# Patient Record
Sex: Female | Born: 1980 | Race: White | Hispanic: No | Marital: Married | Smoking: Never smoker
Health system: Southern US, Community
[De-identification: ages and names within clinical notes are randomized; demographics above are authoritative.]

## PROBLEM LIST (undated history)

## (undated) DIAGNOSIS — Z8669 Personal history of other diseases of the nervous system and sense organs: Secondary | ICD-10-CM

## (undated) DIAGNOSIS — F419 Anxiety disorder, unspecified: Secondary | ICD-10-CM

## (undated) DIAGNOSIS — S52502A Unspecified fracture of the lower end of left radius, initial encounter for closed fracture: Secondary | ICD-10-CM

## (undated) DIAGNOSIS — F319 Bipolar disorder, unspecified: Secondary | ICD-10-CM

## (undated) DIAGNOSIS — I10 Essential (primary) hypertension: Secondary | ICD-10-CM

## (undated) DIAGNOSIS — Z8679 Personal history of other diseases of the circulatory system: Secondary | ICD-10-CM

## (undated) DIAGNOSIS — M6740 Ganglion, unspecified site: Secondary | ICD-10-CM

## (undated) DIAGNOSIS — E079 Disorder of thyroid, unspecified: Secondary | ICD-10-CM

## (undated) DIAGNOSIS — E119 Type 2 diabetes mellitus without complications: Secondary | ICD-10-CM

## (undated) DIAGNOSIS — E039 Hypothyroidism, unspecified: Secondary | ICD-10-CM

## (undated) DIAGNOSIS — Q969 Turner's syndrome, unspecified: Secondary | ICD-10-CM

## (undated) DIAGNOSIS — G43909 Migraine, unspecified, not intractable, without status migrainosus: Secondary | ICD-10-CM

## (undated) HISTORY — PX: CHOLECYSTECTOMY: SHX55

## (undated) HISTORY — DX: Migraine, unspecified, not intractable, without status migrainosus: G43.909

## (undated) HISTORY — DX: Bipolar disorder, unspecified: F31.9

## (undated) HISTORY — PX: COLONOSCOPY: SHX174

## (undated) HISTORY — DX: Anxiety disorder, unspecified: F41.9

## (undated) HISTORY — PX: CORNEAL TRANSPLANT: SHX108

## (undated) HISTORY — DX: Disorder of thyroid, unspecified: E07.9

---
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
## 2001-08-10 ENCOUNTER — Encounter: Payer: Self-pay | Admitting: *Deleted

## 2001-08-10 ENCOUNTER — Emergency Department (HOSPITAL_COMMUNITY): Admission: EM | Admit: 2001-08-10 | Discharge: 2001-08-11 | Payer: Self-pay | Admitting: Emergency Medicine

## 2002-04-22 ENCOUNTER — Emergency Department (HOSPITAL_COMMUNITY): Admission: EM | Admit: 2002-04-22 | Discharge: 2002-04-23 | Payer: Self-pay | Admitting: Emergency Medicine

## 2002-04-23 ENCOUNTER — Encounter: Payer: Self-pay | Admitting: Emergency Medicine

## 2003-08-11 ENCOUNTER — Emergency Department (HOSPITAL_COMMUNITY): Admission: EM | Admit: 2003-08-11 | Discharge: 2003-08-11 | Payer: Self-pay | Admitting: Emergency Medicine

## 2003-12-06 ENCOUNTER — Other Ambulatory Visit: Admission: RE | Admit: 2003-12-06 | Discharge: 2003-12-06 | Payer: Self-pay | Admitting: Obstetrics and Gynecology

## 2005-01-07 ENCOUNTER — Other Ambulatory Visit: Admission: RE | Admit: 2005-01-07 | Discharge: 2005-01-07 | Payer: Self-pay | Admitting: Obstetrics and Gynecology

## 2006-03-17 HISTORY — PX: OTHER SURGICAL HISTORY: SHX169

## 2006-03-17 HISTORY — PX: CHOLECYSTECTOMY: SHX55

## 2007-03-18 HISTORY — PX: CORNEAL TRANSPLANT: SHX108

## 2007-05-12 ENCOUNTER — Observation Stay (HOSPITAL_COMMUNITY): Admission: EM | Admit: 2007-05-12 | Discharge: 2007-05-13 | Payer: Self-pay | Admitting: Emergency Medicine

## 2007-11-13 ENCOUNTER — Ambulatory Visit: Payer: Self-pay | Admitting: *Deleted

## 2007-11-13 ENCOUNTER — Emergency Department (HOSPITAL_COMMUNITY): Admission: EM | Admit: 2007-11-13 | Discharge: 2007-11-14 | Payer: Self-pay | Admitting: Emergency Medicine

## 2007-11-14 ENCOUNTER — Inpatient Hospital Stay (HOSPITAL_COMMUNITY): Admission: AD | Admit: 2007-11-14 | Discharge: 2007-11-18 | Payer: Self-pay | Admitting: *Deleted

## 2007-12-03 ENCOUNTER — Emergency Department (HOSPITAL_COMMUNITY): Admission: EM | Admit: 2007-12-03 | Discharge: 2007-12-03 | Payer: Self-pay | Admitting: Emergency Medicine

## 2007-12-22 ENCOUNTER — Emergency Department (HOSPITAL_COMMUNITY): Admission: EM | Admit: 2007-12-22 | Discharge: 2007-12-23 | Payer: Self-pay | Admitting: Emergency Medicine

## 2007-12-23 ENCOUNTER — Ambulatory Visit: Payer: Self-pay | Admitting: Psychiatry

## 2007-12-23 ENCOUNTER — Inpatient Hospital Stay (HOSPITAL_COMMUNITY): Admission: AD | Admit: 2007-12-23 | Discharge: 2007-12-30 | Payer: Self-pay | Admitting: Psychiatry

## 2010-04-07 ENCOUNTER — Encounter: Payer: Self-pay | Admitting: Internal Medicine

## 2010-07-30 NOTE — Discharge Summary (Signed)
NAME:  Holly Richards, Holly Richards NO.:  [id number]   MEDICAL RECORD NO.:  [id number]          PATIENT TYPE:  IPS   LOCATION:  0301                          FACILITY:  [facility]   PHYSICIAN:  Anselm Jungling, MD  DATE OF BIRTH:  07/24/1980   DATE OF ADMISSION:  12/23/2007  DATE OF DISCHARGE:  12/30/2007                               DISCHARGE SUMMARY   IDENTIFYING DATA/REASON FOR ADMISSION:  This was an inpatient  psychiatric admission for [facility], a 30 year old single Caucasian female  who was admitted due to sudden onset of altered mental status, with  confusion, and reported auditory hallucinations.  She had a similar  presentation and admission to our facility within the past 2 months.  She came to [place] as a patient of her psychiatrist, Dr. Evelene Croon, and her  therapist, Britta Mccreedy.  Please refer to the admission note for further  details pertaining to the symptoms, circumstances and history that led  to her hospitalization.  She was given an initial Axis I diagnosis of  rule out psychosis NOS.   MEDICAL/LABORATORY:  The patient is in generally good health, but has a  history of hypothyroidism.  She was medically and physically assessed by  the psychiatric nurse practitioner.  She was continued on her usual  Synthroid 88 mcg daily, and Pred Forte drops 4 times daily.  She was  also continued on her usual Zocor 20 mg daily.  There were no  significant medical issues.   HOSPITAL COURSE:  The patient was admitted to the adult inpatient  psychiatric service.  The patient has Turner syndrome.  She has typical  body habitus.  Otherwise she is well-nourished and normally developed.  The patient was initially on one-to-one observation due to be  unpredictable an impulsive and emotional nature of her behaviors in the  prehospital and admission process.  Her medications were held initially.   The undersigned had first encountered the patient in February of this  year, when she was hospitalized  in the medical hospital due to sudden  alteration in mental status.  At that time, medical causes for this were  ruled out.  As it turned out, the patient, just prior to this event, had  restarted Celexa at a high dose of 80 mg daily, after being off it for  some time, and had combined it with prescription Vicodin.  Her altered  mental status and brief delirium was attributed to some interaction  related to these medications.  She was subsequently sent home and return  to the outpatient care of Dr. Evelene Croon.   More recently, she was admitted to our inpatient psychiatric facility  with similar history and presentation, and then for this most current  hospital stay, the presenting signs and symptoms were very similar  again.  That is, she was confused, disoriented, claimed that she was  someone named [place] who live in [place], [place], and acted  as though she did not recognize her husband.   Based upon the patient's presentation during this inpatient stay as well  as her previous presentations, it was felt  that the patient was not  showing signs or symptoms of true psychotic disorder.  Rather, it  appeared that she was exhibiting episodes that were explainable through  some dissociative mechanism or some form of conversion.   The patient does have a true history of obsessive-compulsive disorder  and because of this she has been on various anti-obsessional agents,  including other SSRIs, and the current regimen that she has had as an  outpatient, consisting of Neurontin and Zoloft.  However, these  medications were held initially pending observation and further  assessment of her overall situation and diagnosis.   Dr. Evelene Croon was contacted and the undersigned discussed the patient's  history and treatment strategies at length.  Dr. Evelene Croon was in agreement  that the patient's episodic confusion appeared to be based on some form  of dissociative reaction and that antipsychotic medication  did not  appear to be indicated.  We agreed upon resumption of her Zoloft, with  stepwise increase of her daily dosage to 200 mg daily, and discharge  back to outpatient care once the patient was behaviorally stable.  This  was also discussed with the patient's mother and had lengthy telephone  conversation that the undersigned had with her on December 28, 2007.   The patient's thinking, behavior, and mood appeared to stabilize quite  well over the first 2-3 days of her inpatient stay.  One-to-one  observation was discontinued, and she was subsequently able to be  transferred to the open psychiatric unit where she participated in  therapeutic groups and activities and generally did well.   She was discharged on the 8th hospital day in good spirits, fully in  touch with reality, and agreeable to following aftercare plan.   AFTERCARE:  The patient was to follow up with Dr. Evelene Croon, with a followup  appointment scheduled for November 10.  In addition, she was to see her  usual therapist, Britta Mccreedy on January 03, 2008.   DISCHARGE MEDICATIONS:  1. Synthroid 88 mcg daily.  2. Pred Forte drops 4 daily.  3. Zocor 20 mg daily.  4. Neurontin 300 mg q.i.d.  5. Zoloft 200 mg daily.  6. Ambien 10 mg at bedtime p.r.n. insomnia.   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  Axis II:  Borderline personality traits.  Axis III:  History of hypothyroidism, hyperlipidemia.  Axis IV:  Stressors severe.  Axis V:  GAF on discharge.      Anselm Jungling, MD  Electronically Signed     SPB/MEDQ  D:  12/30/2007  T:  12/30/2007  Job:  [id number]

## 2010-07-30 NOTE — Discharge Summary (Signed)
NAMEMarland Richards  AGUSTA, HACKENBERG NO.:  [id number]   MEDICAL RECORD NO.:  [id number]          PATIENT TYPE:  IPS   LOCATION:  0307                          FACILITY:  [facility]   PHYSICIAN:  Jasmine Pang, M.D. DATE OF BIRTH:  1980/04/27   DATE OF ADMISSION:  11/14/2007  DATE OF DISCHARGE:  11/18/2007                               DISCHARGE SUMMARY   IDENTIFICATION:  This is a 30 year old married female who was admitted  on a voluntary basis on November 13, 2007.   HISTORY OF PRESENT ILLNESS:  The patient has a history of suicidal  thoughts.  She is unable to contract.  She states she got mad  yesterday.  She has been reporting pain in her arms.  The patient has  Turner syndrome.  She has had mental status changes in the past several  months with mood changes occurring.  She has a history of OCD.  She  states she went to [facility] and then [facility].  She was  complaining of pain in her arm and they checked her heart.  She was  upset because they did not take care of her arm pain.  She admitted to  the [facility] physician to positive suicidal ideation.  She agreed to  come to [facility].  She states she was told she  was going to have a private room.  She got very upset when she was  placed in her room with a roommate.  She is not able to move past the  fact that she has a roommate.  She has been very irritable.   PAST PSYCHIATRIC HISTORY:  This is the first [facility] admission for the  patient.  She currently sees Dr. Evelene Croon for medication management.   ALCOHOL AND DRUG HISTORY:  None known.   MEDICAL PROBLEMS:  Turner syndrome.   MEDICATIONS:  1. Celexa 80 mg daily.  She is on this for obsessive-compulsive      disorder.  2. Gabapentin 300 mg t.i.d.  3. Ativan 0.5 mg t.i.d.   DRUG ALLERGIES:  CODEINE.   PHYSICAL FINDINGS:  The patient was assessed at the [facility] ED  with a negative Doppler.  There were no acute physical or  medical  problems noted.   DIAGNOSTIC STUDIES:  Alcohol level was less than 5.  UDS was positive  for benzodiazepines.  Urine pregnancy test was negative.  CBC was within  normal limits.   HOSPITAL COURSE:  Upon admission, the patient was placed on her home  medications of Ativan 0.5 mg p.o. t.i.d. and Celexa 80 mg daily.  She  was also placed on Ambien 10 mg p.o. q.h.s. p.r.n. sleep, Motrin 600 mg  p.o. q.6 hours p.r.n. pain, and gabapentin 300 mg p.o. q.i.d. (home  medication).  In addition, she was placed on her Synthroid 88 mcg one  daily.  In individual sessions, the patient was depressed and anxious.  She was also irritable.  She continued to focus on the fact that she had  a roommate and states she was told she was  going to have a private room.  There was positive suicidal ideation.  She did not feel the Celexa had  been beneficial.  This was discontinued and she was started on Zoloft 50  mg p.o. daily x2 days then 100 mg p.o. daily.  She was also started on  Seroquel 50 mg q.4 hours p.r.n. anxiety.  The patient tolerated her  medications well with no significant side effects.  She did participate  appropriately in unit therapeutic groups and activities.  Social worker  spoke with the patient's husband to confirm that his guns have been  secured.  He stated all the guns will be at out of the house before the  patient gets home.  On November 16, 2007, the patient was still  depressed and anxious.  There was some positive suicidal ideation.  She  talked with her husband the day before and this went well.  He visited,  and she was happy to see him.  She was anticipating family session with  her husband and mother.  On November 17, 2007, the patient was still  somewhat depressed and anxious.  On November 18, 2007, sleep was good,  appetite was good.  She was less depressed, less anxious.  Affect was  consistent with mood.  There was no suicidal or homicidal ideation.  No  thoughts  of self-injurious behavior.  No auditory or visual  hallucinations.  No paranoia or delusions.  Thoughts were logical and  goal-directed.  Thought content, no predominant theme.  Cognitive was  grossly back to baseline.  She was worried about not being ready for  going home, but after the family session, felt much better.  She  received a lot of support in the session.  They made a plan for how to  surround her with people constantly in order for her to be able to leave  the hospital.  Family very much wanted her home.   DISCHARGE DIAGNOSES:  Axis I:  Mood disorder not otherwise specified.  Obsessive-compulsive disorder.  Axis II:  None.  Axis III:  Turner syndrome.  Axis IV:  Moderate (problems with primary support group, medical  problems, burden of psychiatric illness).  Axis V:  Global assessment of functioning upon discharge was 50.  GAF  upon admission was 35.  GAF highest past year was 60-65.   DISCHARGE PLAN:  There was no specific activity level or dietary  restrictions.   POSTHOSPITAL CARE PLANS:  The patient will see Dr. Evelene Croon, her  psychiatrist on November 25, 2007, at 5 o'clock p.m.  She will also see  a counselor, Karie Georges on November 25, 2007, at 2 o'clock p.m.   DISCHARGE MEDICATIONS:  1. Lorazepam 0.5 mg t.i.d.  2. Neurontin 300 mg q.i.d.  3. Levothyroxine 88 mcg daily.  4. Sertraline 100 mg daily.  5. Ambien 10 mg at bedtime.  6. Seroquel 50 mg every 4 hours as needed for anxiety.      Jasmine Pang, M.D.  Electronically Signed     BHS/MEDQ  D:  11/18/2007  T:  11/19/2007  Job:  [id number]

## 2010-07-30 NOTE — Discharge Summary (Signed)
NAMETAMICO, MUNDO NO.:  [id number]   MEDICAL RECORD NO.:  [id number]          PATIENT TYPE:  INP   LOCATION:  5122                         FACILITY:  [facility]   PHYSICIAN:  Eduard Clos, MDDATE OF BIRTH:  [birth year]   DATE OF ADMISSION:  05/11/2007  DATE OF DISCHARGE:  05/13/2007                               DISCHARGE SUMMARY   HOSPITAL COURSE:  A 30 year old female with known history of depression,  history of Turner's syndrome, hyperlipidemia. It was noted the patient  was having spells of confusion and the patient was given Geodon and  Ativan to sedate and is admitted to medical floor for further work-up.  Patient also had a CAT scan of the head which did not show any acute  findings.  Subsequently patient became oriented and felt better.  Since  patient had this headache primarily on the left side behind her eyes,  ophthalmology was consulted, as patient had a recent left corneal  transplant for scarred cornea.  Per ophthalmologist, patient did not  have any acute eye findings and advised to continue the present  medication for eyes and to follow with her ophthalmologist once  discharged.  Secondary consult was obtained as per Dr. __________ ,  psychiatrist.  Patient had delirium from white coat and along with  Celexa and advised to start Vicodin.  For her pain, patient was getting  relief from Darvocet and headache was relieved after Darvocet was given.  At time of discharge, patient was hemodynamically stable.   ASSESSMENT:  1. Delirium from medications.  2. Headache with history of recent corneal transplant.  3. Hyperlipidemia.  4. Depression.  5. History of Turner's syndrome.   DISCHARGE MEDICATIONS:  1. Omnipred one drop to the left eye twice daily.  2. Zymar one drop to left eye q.4h.  3. Crestor 10 mg p.o. daily.  4. Neurontin 300 mg p.o. daily.  5. Tricor 145 mg p.o. daily.  6. Ativan 0.5 mg p.o. three times a day.  7. Celexa 80 mg  p.o. daily.  8. Synthroid 88 mcg p.o. daily.  9. Darvocet N 100 one tablet p.o. q.12h. p.r.n. pain.   PLAN:  Patient advised to follow up with ophthalmologist at discharge.  To follow up with psychiatrist within a week's time.  To follow up with  primary care physician in one to two weeks and check TSH levels.  Patient is to be on a cardiac healthy diet.     Eduard Clos, MD  Electronically Signed    ANK/MEDQ  D:  05/13/2007  T:  05/13/2007  Job:  [id number]

## 2010-07-30 NOTE — H&P (Signed)
NAME:  LINNELL, SWORDS NO.:  [id number]   MEDICAL RECORD NO.:  [id number]          PATIENT TYPE:  EMS   LOCATION:  MAJO                         FACILITY:  [facility]   PHYSICIAN:  Altha Harm, MDDATE OF BIRTH:  06-Aug-1980   DATE OF ADMISSION:  05/11/2007  DATE OF DISCHARGE:                              HISTORY & PHYSICAL   CHIEF COMPLAINT:  Change in mental status.   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  According to the patient's husband, he came in  today and found the patient on the floor.  Said she was not feeling  well, got her up to the bed, at which time she went to sleep.  She awoke  and where she was initially confused appeared to have full return of  normal mental status.  However, subsequent to that, the patient became  very combative stating that her name was [place], and that she lived in  [place].  The patient was speaking to her mother, but did not know it  was her mother.  The patient's mother states that she identified her as  mommy, however, did not know what her name was and became very upset  when they addressed her by her given name Hermine.  Of note, the patient  had a corneal transplant approximately one week ago, but according to  her family has been recovering well from it, except for an occasional  headache.  The patient has, according to her mom, severe OCD and has  been on several medications including Celexa and Gabapentin.  However,  from the count of pills in the patient's bottle, it appears that the  patient has not been taking her Celexa in a consistent manner.  Please  note that the patient has been sedated with Geodon and Ativan by the  emergency room and is unable to contribute any information to her  history of present illness.   The patient does have many hormonal  abnormalities and is presently taking her sex hormone replacement  medications in the form of a birth control pill.  However, it also  appears that the patient may have had some hypothyroidism which can be  associated with Turner syndrome and somehow has discontinued taking her  Synthroid.  The patient had been under the care of an endocrinologist  but has stopped going to the endocrinologist and was getting her thyroid  hormones from her OB/GYN.  The mother and the husband both are unable to  count for why the thyroid hormones were discontinued.  However, it  appears that the patient has not been taking her thyroid replacement  hormone for some time now.   PAST MEDICAL HISTORY:  1. Turner syndrome with gonadal failure.  2. Hypothyroidism.  3. OCD.  4. HPV virus.  5. Perimenopause.  6. 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  7. 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  8. Hyperlipidemia.   FAMILY HISTORY:  OCD in the patient's mother.   SOCIAL HISTORY:  The patient is married, lives with her husband.  There  is no tobacco, alcohol or drug use and no children.  According to her  husband, she has been under an enormous amount of stress as she is  currently unemployed and has not been able to find a job.  The patient  has also been dealing with the stress of finding out that she is  perimenopausal associated with her Turner syndrome.   CURRENT MEDICATIONS:  1. Omnipred 1% eye drops to the left eye twice a day.  2. Zymar eye drops to the left eye, the family thinks it is once a      day.  3. Crestor 10 mg p.o. daily.  4. Neurontin 300 mg p.o. q.i.d.  5. Tricor 145 mg p.o. daily.  6. Celexa 80 mg p.o. daily.  7. Ativan 0.5 mg p.o. t.i.d.   ALLERGIES:  CODEINE.   PRIMARY CARE PHYSICIAN:  [facility].   REVIEW  OF SYSTEMS:  The patient is currently sedated, unable to  contribute anything to her review of systems.   STUDIES DONE IN THE EMERGENCY ROOM:  CT of the head was negative for any  acute intracranial findings.  Sodium 135, potassium 3.7, chloride 105,  bicarbonate 22, BUN 9, creatinine 0.49, alcohol level less than 5.  UDS  positive for opiates and benzodiazepines.  TSH has been sent and is  pending.  In the ER, the patient received 10 mg of Geodon IM, 1 mg of  Ativan IV and 10 mg of Reglan IV for nausea.   PHYSICAL EXAMINATION:  GENERAL:  Please note the physical examination is  extremely limited as the patient in her state of stupor is uncooperative  with the examination due to her degree of sedation.  VITAL SIGNS:  Blood pressure 136/86, heart rate currently is 89,  respiratory rate 16, O2 saturation 100% on room air.  Last temperature  taken was 96.9 orally, and temperature before that was 97 orally.  The  patient feels warm to the touch.  HEENT:  I am unable to perform an HEENT examination due to the patient's  level of sedation and combativeness when attempting to be aroused.  LUNGS:  Clear to auscultation.  Normal respiratory pattern including  excursion bilaterally.  CARDIOVASCULAR:  Normal S1, S2.  Cannot appreciate any murmurs, rubs or  gallops.  PMI is nondisplaced.  ABDOMEN:  The patient has normoactive bowel sounds.  There does not  appear to be any tenderness to palpation.  However, the patient is not  cooperative for the full abdominal examination.  NEUROLOGICAL:  I am unable to get a good neurological examination due to  the patient's level of sedation.  It will be deferred until the patient  is more aroused.   ASSESSMENT/PLAN:  This patient presents with what appears to be a  psychotic break.  So far, studies have not been revealing of anything  that would give a medical basis to her presentation.  The only  outstanding information would be her TSH which very well  might reveal  that the patient is hypothyroid.  That, however, would not account for  her behavior as she should rather be in a comatose state rather than a  state of  acute mania.  I would recommend that the patient be in  Psychiatry as soon as possible.  I am more concerned about the fact that  the patient is supposed to be on Celexa, and it is based upon the amount  of medication that is retained in the bottle.  It is clear that she has  not been taking it consistently.  Mania being a side effect of Celexa,  depending on how the patient has been taking it, she has been on a very  hefty dose of Celexa, and this could be most contributory to this.  When  the patient is more awake, she will need to be more thoroughly examined,  and I will also add a CBC on this to make sure the patient  does not have any abnormalities which can be contributing.  Again, my  examination assessment of this patient is rather limited.  We will hold  the patient, observe her, and make sure her TSH is within normal limits.  However, I think it is most important that this patient be seen by  Psychiatry in expedient manner.      Altha Harm, MD  Electronically Signed     MAM/MEDQ  D:  05/12/2007  T:  05/12/2007  Job:  [id number]

## 2010-07-30 NOTE — Consult Note (Signed)
NAMEMarland Kitchen  RONEE, RANGANATHAN NO.:  [id number]   MEDICAL RECORD NO.:  [id number]          PATIENT TYPE:  INP   LOCATION:  5122                         FACILITY:  [facility]   PHYSICIAN:  Anselm Jungling, MD  DATE OF BIRTH:  Sep 23, 1980   DATE OF CONSULTATION:  05/13/2007  DATE OF DISCHARGE:                                 CONSULTATION   IDENTIFYING DATA/REASON FOR REFERRAL:  The patient is a 30 year old  married Caucasian female who was referred due to episodic delirium and  confusion.   HISTORY OF PRESENT PROBLEM:  The patient is a 30 year old married white  female without children.  She has a history of Turner syndrome and  severe obsessive-compulsive disorder.  She is a patient of Dr. Evelene Croon, her  psychiatrist.   Information is gathered from the chart, from interviewing the patient  and her mother who was present.   The patient's current psychotropic medication regimen consists of Celexa  80 mg daily, Neurontin 300 mg q.i.d..  In addition, she takes non  psychotropic medications that include levothyroxine, Zymar, prednisone  eye drops, Tricor, Zocor, Protonix and Reglan.   Approximately one week ago she had corneal surgery, and was sent home  with prescription for Vicodin.   On the day of admission, May 12, 2007, her husband found her on the  floor in a state of confusion.  She got up and went to bed, and then for  a while seemed to return back to normal, but following this, was acutely  delirious and combative.  This led to her being brought to the hospital,  where she was given intramuscular Geodon.   Since then, she has been under the care of Dr. Zara Council, and has had  ophthalmological follow-up consultation.   MENTAL STATUS AND OBSERVATIONS:  The patient is a well-nourished,  normally-developed adult female who is awake, alert, pleasant and  cooperative.  She is visiting with her mother and another family member  or relative.  She tells me, I did not  know myself, I was delusional.  We discussed her history of obsessive-compulsive disorder.  She and her  mother indicate that although Celexa 80 mg daily has been an effective  medication for her, she has been taking it inconsistently lately.  On  the day that she initially became delirious at home, she had taken an 80  mg dose of Celexa, not having taken it for awhile, in combination with  the Vicodin.   The patient currently shows no signs or symptoms of psychosis or  delirium whatsoever.  She is in good spirits.  She is looking forward to  going home.  She appears to have a good relationship with her mother,  who is very supportive.   IMPRESSION:  AXIS I:  Obsessive-compulsive disorder, status post to  toxic delirium, resolving.  AXIS II:  Deferred.  AXIS III:  History of Turner syndrome, status post corneal surgery,  history of hypertension, history of GERD, allergy to codeine, intolerant  of Vicodin and Percocet.  AXIS IV:  Stressors severe.  AXIS V:  GAF 85.  RECOMMENDATIONS:  At this time, I have no further specific  recommendations.  I think it is reasonable to continue her Celexa 80 mg  daily at this point along with her usual Neurontin.  She and her mother  agreed to have her receive follow-up from Dr. Evelene Croon in the very near  future.  She will continue with her usual medications until then.   Following my interview with the patient and her mother, I spoke to Dr.  Zara Council and relayed my recommendations.      Anselm Jungling, MD  Electronically Signed     SPB/MEDQ  D:  05/13/2007  T:  05/13/2007  Job:  [id number]
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
## 2010-10-31 ENCOUNTER — Other Ambulatory Visit: Payer: Self-pay | Admitting: Obstetrics and Gynecology

## 2010-10-31 DIAGNOSIS — R300 Dysuria: Secondary | ICD-10-CM

## 2010-10-31 DIAGNOSIS — R102 Pelvic and perineal pain: Secondary | ICD-10-CM

## 2010-11-03 ENCOUNTER — Other Ambulatory Visit: Payer: Self-pay

## 2010-11-06 ENCOUNTER — Emergency Department (HOSPITAL_COMMUNITY): Payer: PRIVATE HEALTH INSURANCE

## 2010-11-06 ENCOUNTER — Inpatient Hospital Stay (HOSPITAL_COMMUNITY)
Admission: EM | Admit: 2010-11-06 | Discharge: 2010-11-12 | DRG: 392 | Disposition: A | Discharge status: Home / Self Care | Payer: PRIVATE HEALTH INSURANCE | Source: Ambulatory Visit | Attending: Family Medicine | Admitting: Family Medicine

## 2010-11-06 ENCOUNTER — Encounter: Payer: Self-pay | Admitting: Family Medicine

## 2010-11-06 DIAGNOSIS — R109 Unspecified abdominal pain: Secondary | ICD-10-CM

## 2010-11-06 DIAGNOSIS — B977 Papillomavirus as the cause of diseases classified elsewhere: Secondary | ICD-10-CM | POA: Diagnosis present

## 2010-11-06 DIAGNOSIS — Q969 Turner's syndrome, unspecified: Secondary | ICD-10-CM

## 2010-11-06 DIAGNOSIS — Q231 Congenital insufficiency of aortic valve: Secondary | ICD-10-CM

## 2010-11-06 DIAGNOSIS — R197 Diarrhea, unspecified: Secondary | ICD-10-CM | POA: Diagnosis present

## 2010-11-06 DIAGNOSIS — E119 Type 2 diabetes mellitus without complications: Secondary | ICD-10-CM

## 2010-11-06 DIAGNOSIS — K900 Celiac disease: Principal | ICD-10-CM | POA: Diagnosis present

## 2010-11-06 DIAGNOSIS — K2970 Gastritis, unspecified, without bleeding: Secondary | ICD-10-CM | POA: Diagnosis present

## 2010-11-06 DIAGNOSIS — E039 Hypothyroidism, unspecified: Secondary | ICD-10-CM | POA: Diagnosis present

## 2010-11-06 DIAGNOSIS — M25559 Pain in unspecified hip: Secondary | ICD-10-CM | POA: Diagnosis present

## 2010-11-06 DIAGNOSIS — F39 Unspecified mood [affective] disorder: Secondary | ICD-10-CM | POA: Diagnosis present

## 2010-11-06 DIAGNOSIS — R112 Nausea with vomiting, unspecified: Secondary | ICD-10-CM | POA: Diagnosis present

## 2010-11-06 DIAGNOSIS — K2990 Gastroduodenitis, unspecified, without bleeding: Secondary | ICD-10-CM | POA: Diagnosis present

## 2010-11-06 DIAGNOSIS — E785 Hyperlipidemia, unspecified: Secondary | ICD-10-CM | POA: Diagnosis present

## 2010-11-06 LAB — URINALYSIS, ROUTINE W REFLEX MICROSCOPIC
Ketones, ur: NEGATIVE mg/dL
Leukocytes, UA: NEGATIVE
Nitrite: NEGATIVE
Specific Gravity, Urine: 1.005 (ref 1.005–1.030)
pH: 8 (ref 5.0–8.0)

## 2010-11-06 LAB — POCT PREGNANCY, URINE: Preg Test, Ur: NEGATIVE

## 2010-11-06 LAB — COMPREHENSIVE METABOLIC PANEL
AST: 61 U/L — ABNORMAL HIGH (ref 0–37)
Albumin: 3.9 g/dL (ref 3.5–5.2)
Chloride: 101 mEq/L (ref 96–112)
Creatinine, Ser: 0.48 mg/dL — ABNORMAL LOW (ref 0.50–1.10)
Total Bilirubin: 0.4 mg/dL (ref 0.3–1.2)
Total Protein: 7.4 g/dL (ref 6.0–8.3)

## 2010-11-06 LAB — DIFFERENTIAL
Basophils Absolute: 0 10*3/uL (ref 0.0–0.1)
Basophils Relative: 0 % (ref 0–1)
Neutro Abs: 8.7 10*3/uL — ABNORMAL HIGH (ref 1.7–7.7)
Neutrophils Relative %: 82 % — ABNORMAL HIGH (ref 43–77)

## 2010-11-06 LAB — CBC
Hemoglobin: 14.9 g/dL (ref 12.0–15.0)
RBC: 5.01 MIL/uL (ref 3.87–5.11)

## 2010-11-06 LAB — TSH: TSH: 12.727 u[IU]/mL — ABNORMAL HIGH (ref 0.350–4.500)

## 2010-11-06 LAB — LACTIC ACID, PLASMA: Lactic Acid, Venous: 3.2 mmol/L — ABNORMAL HIGH (ref 0.5–2.2)

## 2010-11-06 NOTE — H&P (Signed)
Family Medicine Teaching [facility] Admission History and Physical  Patient name: IVANA NICASTRO Medical record number: [id number] Date of birth: October 18, 1980 Age: 30 y.o. Gender: female  Primary Care Provider: No primary provider on file.  Chief Complaint: Abdominal and pelvic pain History of Present Illness: AMAIRANI SHUEY is a 30 y.o. 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 Pt states that 2 and 1/2 weeks ago she had sudden, diffuse abdominal pain that made it hard for her to move. The pain is greater on her R side than her L but is also felt in her pelvis. The pain increased with movement and palpation of her abdomen. She has also had non-bloody emesis. Pt endorses dysuria, but no hematuria.   Pt was initially seen 2 weeks ago by Urgent Care and was sent to Dr. Arelia Sneddon, an OB-GYN. Pt had a pelvic MRI (because she could not tolerate a pelvic U/S given her pain) that did not show any abnormalities- no masses, no ovarian torsion, no thrombus and he suggested that pt may have interstitial cystitis. She also had negative Gonorrhea/Chlamydia test. Pt has been seen at [facility] twice in the past few weeks for the same symptoms.  During this hospitalization she had a normal pelvic and abdominal CT scan. Previous workups also included being treated for BV twice with Metronidazole and treated with Ciprofloxacin for a presumed UTI. Pt states she was dismissed by her PCP after not showing up to a Physical Therapy referral that her PCP had advised she go to. Pt and her family are visibly upset about recurrence of the pt's pain and irritation with different hospital systems.  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 She has no children. Pt denies tobacco, alcohol or drug use.  Family History: OCD in patient's mother  Allergies: Codeine per chart, NKDA per patient  Medications: 1. Sertraline 100mg  QD 2. Risperidone 2mg  QHS 3. Trazodone 150mg  PO QHS   Review Of Systems: Per HPI with the following additions:  Otherwise 12 point review of systems was performed and was unremarkable.  Physical Exam: Pulse: 98  Blood Pressure: 121/86 RR: 20   O2: 100% on RA Temp: 98.4  General: Patient has Turner's body habitus. Before admin of pain meds: pt is extremely uncomfortable and moves around frequently because she's in pain. After pain meds: pt is much calmer. HEENT: MMM Heart: Tachycardic rate, reg rhythm, no murmurs/rubs/gallops Lungs: CTAB, no wheezes/rales/rhonchi Abdomen: (Done post pain meds) Nl BS; Soft, obtuberant abdomen; tender to palpation diffusely but with distraction pain is less with palpation. Some guarding but this is supressable. Patient does have rebound however 4 seconds after my hand was removed the patient flinched. She did not have pain when I was jostling the bed. Extremities: No peripheral edema   Labs and Imaging: Lab Results  Component Value Date/Time   NA 136 11/06/2010 11:59 AM   K 4.1 11/06/2010 11:59 AM   CL 101 11/06/2010 11:59 AM   CO2 21 11/06/2010 11:59 AM   BUN 6 11/06/2010 11:59 AM   CREATININE 0.48* 11/06/2010 11:59 AM   GLUCOSE 107* 11/06/2010 11:59 AM   Lab Results  Component Value Date   WBC 10.6* 11/06/2010  HGB 14.9 11/06/2010   HCT 42.9 11/06/2010   MCV 85.6 11/06/2010   PLT 246 11/06/2010   UPT: Negative UA: bland pH of 8.0, otherwise wnl Lipase: 32  Abd U/S: NO acute findings identified. S/p cholecystectomy. Fatty infiltration of the liver.   Assessment and Plan: AILISH PROSPERO is a 30 y.o. year old female with Turner's Syndrome and Hypothyroidism  presenting with severe abdominal and pelvic pain. 1. Abdominal/Pelvic pain: Uncertain etiology. She has had an extensive work up so far which has not shown a cause for her pain. The GYN suspects interstitial cystitis is the cause and recommends a urology consult.  She does have a mild elevation of her liver enzymes however this is consistent with a fatty liver infiltrate seen on ultrasound. I do not think this is the cause for her pain.  Plan: Continue workup with venus lactate to assess for bowel ischemia and a upright Xray to assess for any acute findings such as free air or constipation. Will repeat CBC and CMP in AM. Will treat pain with oxycodone and nausea with zofran.  2. Hypothyroidism: Uncertain. Noted on past notes. Not on synthroid. Will check TSH 3. Mood Disorder: Uncertain but is sever in the past as noted with a psych admission in 2009. On Risperdal, sertraline and trazodone. Will continue home meds and follow.  4. FEN/GI: SLIV, Diet and nausea medications. If unable to tolerate diet will provide IVF.  5. Prophylaxis: Lovenox 40 daily, Protonix 6. Disposition: When work up competed and symptoms resolved.

## 2010-11-07 ENCOUNTER — Inpatient Hospital Stay (HOSPITAL_COMMUNITY): Payer: PRIVATE HEALTH INSURANCE

## 2010-11-07 LAB — HEMOGLOBIN A1C
Hgb A1c MFr Bld: 6.5 % — ABNORMAL HIGH (ref <5.7)
Mean Plasma Glucose: 140 mg/dL — ABNORMAL HIGH (ref <117)

## 2010-11-07 LAB — CBC
HCT: 38.1 % (ref 36.0–46.0)
Platelets: 210 10*3/uL (ref 150–400)
RDW: 12.5 % (ref 11.5–15.5)
WBC: 4.8 10*3/uL (ref 4.0–10.5)

## 2010-11-07 LAB — COMPREHENSIVE METABOLIC PANEL
ALT: 211 U/L — ABNORMAL HIGH (ref 0–35)
AST: 166 U/L — ABNORMAL HIGH (ref 0–37)
Albumin: 3.5 g/dL (ref 3.5–5.2)
Alkaline Phosphatase: 146 U/L — ABNORMAL HIGH (ref 39–117)
Chloride: 101 mEq/L (ref 96–112)
Potassium: 4 mEq/L (ref 3.5–5.1)
Sodium: 137 mEq/L (ref 135–145)
Total Bilirubin: 0.4 mg/dL (ref 0.3–1.2)
Total Protein: 6.8 g/dL (ref 6.0–8.3)

## 2010-11-07 LAB — HEPATITIS PANEL, ACUTE
Hep A IgM: NEGATIVE
Hep B C IgM: NEGATIVE

## 2010-11-07 LAB — C-REACTIVE PROTEIN: CRP: 2.2 mg/dL — ABNORMAL HIGH (ref <0.6)

## 2010-11-07 LAB — ACETAMINOPHEN LEVEL: Acetaminophen (Tylenol), Serum: <15 ug/mL (ref 10–30)

## 2010-11-07 LAB — OCCULT BLOOD X 1 CARD TO LAB, STOOL: Fecal Occult Bld: POSITIVE

## 2010-11-07 LAB — PROTIME-INR: Prothrombin Time: 12.6 seconds (ref 11.6–15.2)

## 2010-11-08 LAB — COMPREHENSIVE METABOLIC PANEL
ALT: 146 U/L — ABNORMAL HIGH (ref 0–35)
AST: 74 U/L — ABNORMAL HIGH (ref 0–37)
Albumin: 3.5 g/dL (ref 3.5–5.2)
Alkaline Phosphatase: 117 U/L (ref 39–117)
BUN: 3 mg/dL — ABNORMAL LOW (ref 6–23)
Calcium: 9.4 mg/dL (ref 8.4–10.5)
Calcium: 9.1 mg/dL (ref 8.4–10.5)
Creatinine, Ser: 0.53 mg/dL (ref 0.50–1.10)
Creatinine, Ser: <0.47 mg/dL — ABNORMAL LOW (ref 0.50–1.10)
GFR calc Af Amer: >60 mL/min (ref >60)
GFR calc non Af Amer: >60 mL/min (ref >60)
Glucose, Bld: 119 mg/dL — ABNORMAL HIGH (ref 70–99)
Glucose, Bld: 101 mg/dL — ABNORMAL HIGH (ref 70–99)
Potassium: 3.4 mEq/L — ABNORMAL LOW (ref 3.5–5.1)
Sodium: 141 mEq/L (ref 135–145)
Total Protein: 6.7 g/dL (ref 6.0–8.3)
Total Protein: 6.4 g/dL (ref 6.0–8.3)

## 2010-11-08 LAB — GIARDIA/CRYPTOSPORIDIUM SCREEN(EIA): Giardia Screen - EIA: NEGATIVE

## 2010-11-08 LAB — CBC
MCH: 28.7 pg (ref 26.0–34.0)
MCHC: 32.7 g/dL (ref 30.0–36.0)
MCV: 87.8 fL (ref 78.0–100.0)
Platelets: 200 10*3/uL (ref 150–400)

## 2010-11-08 LAB — GAMMA GT: GGT: 225 U/L — ABNORMAL HIGH (ref 7–51)

## 2010-11-08 LAB — IRON AND TIBC: TIBC: 373 ug/dL (ref 250–470)

## 2010-11-09 ENCOUNTER — Inpatient Hospital Stay (HOSPITAL_COMMUNITY): Payer: PRIVATE HEALTH INSURANCE

## 2010-11-09 LAB — CBC
HCT: 35.9 % — ABNORMAL LOW (ref 36.0–46.0)
Hemoglobin: 12 g/dL (ref 12.0–15.0)
MCV: 87.1 fL (ref 78.0–100.0)
RBC: 4.12 MIL/uL (ref 3.87–5.11)
RDW: 12.3 % (ref 11.5–15.5)
WBC: 5.3 10*3/uL (ref 4.0–10.5)

## 2010-11-09 LAB — COMPREHENSIVE METABOLIC PANEL
ALT: 107 U/L — ABNORMAL HIGH (ref 0–35)
AST: 43 U/L — ABNORMAL HIGH (ref 0–37)
Alkaline Phosphatase: 114 U/L (ref 39–117)
CO2: 27 mEq/L (ref 19–32)
Calcium: 8.9 mg/dL (ref 8.4–10.5)
Glucose, Bld: 124 mg/dL — ABNORMAL HIGH (ref 70–99)
Potassium: 3.3 mEq/L — ABNORMAL LOW (ref 3.5–5.1)
Sodium: 140 mEq/L (ref 135–145)

## 2010-11-09 LAB — FECAL LACTOFERRIN, QUANT: Fecal Lactoferrin: POSITIVE

## 2010-11-09 LAB — C-REACTIVE PROTEIN: CRP: 0.5 mg/dL — ABNORMAL LOW (ref <0.6)

## 2010-11-09 LAB — CERULOPLASMIN: Ceruloplasmin: 33 mg/dL (ref 20–60)

## 2010-11-10 LAB — COMPREHENSIVE METABOLIC PANEL
AST: 25 U/L (ref 0–37)
Albumin: 3.5 g/dL (ref 3.5–5.2)
Alkaline Phosphatase: 112 U/L (ref 39–117)
Chloride: 103 mEq/L (ref 96–112)
Potassium: 4.1 mEq/L (ref 3.5–5.1)
Total Bilirubin: 0.2 mg/dL — ABNORMAL LOW (ref 0.3–1.2)

## 2010-11-10 LAB — STOOL CULTURE

## 2010-11-11 ENCOUNTER — Other Ambulatory Visit: Payer: Self-pay | Admitting: Gastroenterology

## 2010-11-11 LAB — COMPREHENSIVE METABOLIC PANEL
AST: 31 U/L (ref 0–37)
Albumin: 3.6 g/dL (ref 3.5–5.2)
Calcium: 10 mg/dL (ref 8.4–10.5)
Creatinine, Ser: <0.47 mg/dL — ABNORMAL LOW (ref 0.50–1.10)

## 2010-11-11 LAB — CBC
MCH: 29.2 pg (ref 26.0–34.0)
MCHC: 34.1 g/dL (ref 30.0–36.0)
MCV: 85.6 fL (ref 78.0–100.0)
Platelets: 229 10*3/uL (ref 150–400)
RDW: 12.1 % (ref 11.5–15.5)

## 2010-11-11 LAB — ANTI-SMOOTH MUSCLE ANTIBODY, IGG: F-Actin IgG: 3 U (ref <20)

## 2010-11-12 LAB — CBC
HCT: 38.3 % (ref 36.0–46.0)
Hemoglobin: 13.1 g/dL (ref 12.0–15.0)
RDW: 12.3 % (ref 11.5–15.5)
WBC: 8.4 10*3/uL (ref 4.0–10.5)

## 2010-11-12 LAB — BASIC METABOLIC PANEL
BUN: 5 mg/dL — ABNORMAL LOW (ref 6–23)
Chloride: 100 mEq/L (ref 96–112)
GFR calc Af Amer: >60 mL/min (ref >60)
Potassium: 4 mEq/L (ref 3.5–5.1)

## 2010-11-14 LAB — GLIADIN ANTIBODIES, SERUM
Gliadin IgA: 9.2 U/mL (ref <20)
Gliadin IgG: 63 U/mL — ABNORMAL HIGH (ref <20)

## 2010-11-14 LAB — TISSUE TRANSGLUTAMINASE, IGA: Tissue Transglutaminase Ab, IgA: 7 U/mL (ref <20)

## 2010-11-21 ENCOUNTER — Ambulatory Visit (INDEPENDENT_AMBULATORY_CARE_PROVIDER_SITE_OTHER): Payer: PRIVATE HEALTH INSURANCE | Admitting: Sports Medicine

## 2010-11-21 ENCOUNTER — Encounter: Payer: Self-pay | Admitting: Sports Medicine

## 2010-11-21 DIAGNOSIS — K900 Celiac disease: Secondary | ICD-10-CM

## 2010-11-21 DIAGNOSIS — R109 Unspecified abdominal pain: Secondary | ICD-10-CM | POA: Insufficient documentation

## 2010-11-21 DIAGNOSIS — K219 Gastro-esophageal reflux disease without esophagitis: Secondary | ICD-10-CM | POA: Insufficient documentation

## 2010-11-21 MED ORDER — OMEPRAZOLE 40 MG PO CPDR: 40.0000 mg | DELAYED_RELEASE_CAPSULE | Freq: Every day | ORAL | Status: DC

## 2010-11-21 MED ORDER — DICYCLOMINE HCL 20 MG PO TABS: 20.0000 mg | ORAL_TABLET | Freq: Four times a day (QID) | ORAL | Status: DC | PRN

## 2010-11-21 NOTE — Patient Instructions (Signed)
Good to see you are doing better today!  I'm glad to hear you are figuring out the gluten free diet.    I have refilled your Bentyl although I want you to try to wean yourself off over the next couple of weeks.  I have also refilled your GERD medication although it may be a different name than what you were previously on.  This should be less expensive and will be just as effective.  I also think that weaning your Bentyl will help with your reflux.  Please make an appointment in 6 to 8 weeks to follow up on everything.

## 2010-12-04 NOTE — Consult Note (Signed)
NAME:  Holly Richards, Holly Richards NO.:  [id number]  MEDICAL RECORD NO.:  [id number]  LOCATION:                                 FACILITY:  PHYSICIAN:  Petra Kuba, M.D.    DATE OF BIRTH:  1980/08/31  DATE OF CONSULTATION: DATE OF DISCHARGE:                                CONSULTATION   The patient is seen at the request of the hospitalist for multiple GI complaints for 3 weeks.  She has had abdominal pain.  She has been to the ER a few times and even seen her gynecologist.  She has been referred to a urologist, but has not seen them yet.  On two CAT scans and one MRI, she has a hip cyst and kidney stones, but no other obvious etiology of her problems.  She said she never had pain like this before when her gallbladder was bothering her, especially she had gallstones and that pain was different.  She has had, however, nausea, vomiting, diarrhea, and a little bright red blood per rectum.  Her liver tests were minimally elevated on this hospital stay.  She has no history of previous liver tests elevation she says and was also found be guaiac positive.  She has not had any previous endoscopic studies.  PAST MEDICAL HISTORY:  Pertinent for Turner syndrome, hypothyroidism, mood disorder, kidney stones, increased lipids, and HPV virus.  She has also had a cholecystectomy, left corneal transplant, and sinus surgery.  SOCIAL HISTORY:  She denies tobacco, alcohol, or drug use and denies any vitamins or herbal remedies.  FAMILY HISTORY:  Pertinent for irritable bowel and interstitial cystitis in the family, but no specific GI problems.  ALLERGIES:  CODEINE.  MEDICINES AT HOME:  Sertraline, risperidone, and trazodone.  REVIEW OF SYSTEMS:  Pertinent for none of those medicines are new and all this started all of a sudden that woke her up on morning and went to the emergency room.  PHYSICAL EXAMINATION:  The patient is lying comfortably in the bed, but her belly exam is  tender everywhere with guarding throughout, but no rebound.  LABORATORY DATA:  Pertinent for normal CBC, white count 10.6, neutrophils 82, hemoglobin 14.9 with an MCV of 85.  She was not pregnant.  Urinalysis is negative.  Complete metabolic profile is pertinent for an alk phos of 122, SGOT and SGPT 61 and 83, normal bilirubin and albumin and BUN and creatinine.  Lipase normal on admission.  Lactic acid was slightly high.  TSH was high at 12, but her free T4 was normal.  With hydration, her hemoglobin dropped to 12.9, but her white count and platelet count normalized.  Sed rate normal at 22. C. diff was negative.  Liver tests on recheck did go up a little, alk phos up to 146, SGOT 166, and SGPT 211.  Acute hepatitis panel negative for B and C.  Ultrasound is pertinent for fatty liver.  Two CTs and MRIs were all reviewed.  Other than kidney stones and a right hip cyst with a little bit of inflammation around the cyst, nothing else was seen. Abdominal x-ray was normal.  ASSESSMENT: 1. Abdominal pain, primary problem,  questionable etiology.  Doubt     significant GI pathology. 2. Some gastrointestinal symptoms including nausea, vomiting,     diarrhea, occasionally blood in the stool, and minimal increased     liver test with negative B and C serologies.  PLAN:  Would consider urology or orthopedic consult see the MRI for the right hip abnormality.  We will follow with you and consider a GI workup at some point, but doubt it will be revealing.  I have discussed the above with the patient and her family.  Would like to watch her a few days to see how things develop and how she behaves.          ______________________________ Petra Kuba, M.D.     MEM/MEDQ  D:  11/07/2010  T:  11/07/2010  Job:  [id number]  cc:   Juluis Mire, M.D.  Electronically Signed by Vida Rigger M.D. on 12/04/2010 02:55:25 PM

## 2010-12-04 NOTE — Op Note (Signed)
  [name], Holly NO.:  [id number]  MEDICAL RECORD NO.:  [id number]  LOCATION:  5011                         FACILITY:  [facility]  PHYSICIAN:  Petra Kuba, M.D.    DATE OF BIRTH:  1980/10/10  DATE OF PROCEDURE:  11/10/2010 DATE OF DISCHARGE:                              OPERATIVE REPORT   PROCEDURE:  Esophagogastroduodenoscopy with biopsy.  INDICATION:  The patient with abdominal pain, nausea, vomiting, positive sprue antibodies, want to confirm, consent was signed after risks, benefits, methods, options were thoroughly discussed prior to sedation and yesterday the day before in the hospital.  MEDICINES USED:  Fentanyl 75 mcg, Versed 6 mg, and Benadryl 12.5 mg.  DESCRIPTION OF PROCEDURE:  Video endoscope was inserted by direct vision.  A quick look at the vocal cords were normal.  Scope passed down a normal esophagus into the stomach which was normal, advanced through a normal antrum and pylorus.  She did have a mild amount of bulbitis.  The scope was advanced around the C-loop to the second and third part of the duodenum.  There seemed to be some mild scalloping probably compatible with sprue and multiple duodenal biopsies were obtained.  Scope was withdrawn back to the bulb which confirmed above findings.  Scope was withdrawn back to the stomach and retroflexed.  Cardia, fundus, angularis, lesser and greater curve were normal on retroflex visualization.  Straight visualization of the stomach did not reveal any additional findings.  Air was suctioned.  Scope slowly withdrawn. Again, a good look esophagus was normal.  Scope was removed.  The patient tolerated the procedure well.  There was no obvious immediate complication.  ENDOSCOPIC DIAGNOSES: 1. Mild bulbitis. 2. Probable sprue in the duodenum, status post biopsy. 3. Otherwise normal esophagogastroduodenoscopy.  PLAN:  Await pathology.  Hopefully she will go home soon and follow up in the office  in 1 month on the appropriate diet and see if her symptoms have not resolved.  Happy to see back sooner p.r.n.          ______________________________ Petra Kuba, M.D.     MEM/MEDQ  D:  11/10/2010  T:  11/10/2010  Job:  [id number]  cc:   Juluis Mire, M.D.  Electronically Signed by Vida Rigger M.D. on 12/04/2010 02:55:29 PM

## 2010-12-06 LAB — DIFFERENTIAL
Basophils Absolute: 0.2 — ABNORMAL HIGH
Basophils Relative: 2 — ABNORMAL HIGH
Eosinophils Relative: 2
Lymphocytes Relative: 29
Monocytes Absolute: 0.7
Monocytes Relative: 7

## 2010-12-06 LAB — CBC
HCT: 38.9
Hemoglobin: 13.7
MCHC: 35.1
RBC: 4.38
RDW: 12.5

## 2010-12-06 LAB — COMPREHENSIVE METABOLIC PANEL
ALT: 40 — ABNORMAL HIGH
AST: 28
Albumin: 3.9
CO2: 22
Calcium: 9.2
GFR calc Af Amer: >60
Sodium: 135
Total Protein: 6.3

## 2010-12-06 LAB — URINALYSIS, ROUTINE W REFLEX MICROSCOPIC
Bilirubin Urine: NEGATIVE
Glucose, UA: NEGATIVE
Hgb urine dipstick: NEGATIVE
Ketones, ur: NEGATIVE
pH: 5.5

## 2010-12-06 LAB — RAPID URINE DRUG SCREEN, HOSP PERFORMED
Amphetamines: NOT DETECTED
Barbiturates: NOT DETECTED
Benzodiazepines: POSITIVE — AB
Opiates: POSITIVE — AB

## 2010-12-09 NOTE — Discharge Summary (Signed)
[name], COMES NO.:  [id number]  MEDICAL RECORD NO.:  [id number]  LOCATION:  5011                         FACILITY:  [facility]  PHYSICIAN:  Santiago Bumpers. Aminata Buffalo, M.D.DATE OF BIRTH:  17-Jan-1981  DATE OF ADMISSION:  11/06/2010 DATE OF DISCHARGE:  11/12/2010                              DISCHARGE SUMMARY   PRIMARY CARE PHYSICIAN:  No primary provider.  ADMISSION DIAGNOSES: 1. Abdominal pain. 2. Turner's syndrome. 3. Mood disorder. 4. Hypothyroidism.  DISCHARGE DIAGNOSES: 1. Celiac sprue. 2. Turner's syndrome. 3. Mood disorder. 4. Right benign cortical cyst on right femoral head. 5. Subclinical hypothyroidism.  BRIEF HOSPITAL COURSE:  Holly Richards is a 30 year old female who presented to [facility] with severe abdominal and pelvic pain. She had previously been worked up at outside hospitals over the past 2- 1/2 weeks and had been unable to find an etiology of her pain.  Her pain is greater on the right side than on left, but was also felt to be pelvic in nature.  Pain and movement, palpation of her abdomen made her much worse.  She had not had any hematuria, hematochezia.  She had had a pelvic MRI because she could not tolerate pelvic ultrasound by her OB/GYN that did not show any intra-abdominal abnormalities or intrapelvic abnormalities.  However, it is suggested that patient could have interstitial cystitis, also had this questionable femoral head cortical cyst.  After consulting with Gastroenterology, further workup was undertaken that revealed positive antiplatelet antibody with a value of 63.0, however, showed a tissue transglutaminase of 7, and was reported soon after discharge.  She did have a slow progression throughout her hospitalization and pain meds were required.  However, at discharge her pain was well controlled.  She was tolerating a gluten- free diet and was able to transfer from bed to chair and was able to ambulate with  assistance.  She was set up with home health PT, OT, and Skilled Nursing and was to follow up with [facility] as her new primary care doctor.  Otherwise, her hospital course was uneventful.  DISCHARGE MEDICATIONS: 1. Bentyl 20 mg p.o. q.i.d. 2. Ativan 0.5 mg q.8 hours p.r.n. anxiety. 3. Protonix 40 mg p.o. daily. 4. Phenergan 12.5 mg q.6 hours p.r.n. nausea. 5. Ultram 50 mg q.4 hours p.r.n. pain.  MEDICATIONS:  Home medications to continue. 1. Risperidone 2 mg p.o. daily. 2. Sertraline 100 mg 1 tab p.o. q.a.m. 3. Trazodone 150 mg 1 tab p.o. at bedtime.  Medications were stopped at the time of discharge were furosemide, ketorolac, oxycodone, ciprofloxacin, metronidazole, hydrocodone, and promethazine.  DISCHARGE INSTRUCTIONS:  The patient was to increase activity slowly. Slowly, she was to adhere to gluten-free diet.  She is to follow up with Dr. Berline Chough at [facility] on Thursday, November 21, 2010, at 1:30.  DISCHARGE FOLLOWUP ISSUES:  Transglutaminase needs to be followed and reported to pt.  She will need chronic medical care for her other comorbid conditions, although she has been seen by Psychiatry and Obstetric Gynecology.  She will need to recheck up her TSH and free T3 when medically stable.    ______________________________ Gaspar Bidding, DO  ______________________________ Santiago Bumpers Leveda Anna, M.D.    MR/MEDQ  D:  12/04/2010  T:  12/04/2010  Job:  [id number]  Electronically Signed by Gaspar Bidding  on 12/05/2010 09:02:14 AM Electronically Signed by Doralee Albino M.D. on 12/09/2010 09:04:54 AM

## 2010-12-15 ENCOUNTER — Encounter: Payer: Self-pay | Admitting: Sports Medicine

## 2010-12-15 NOTE — Assessment & Plan Note (Signed)
Maintain gluten free diet.  Symptoms improving over time.  Will continue bentyl for now but plan to ween.  Follow up pain at next visit

## 2010-12-15 NOTE — Progress Notes (Signed)
Holly Richards is here to follow up care from the hospital admission for abdominal pain secondary to Ciliac Sprue.  She is doing better with a gluten free diet and has been regaining her strength gradually.  States that she is continuing to work with PT.  ROS:  No cp, dyspnea, fever, chills.  Some loose stools but no melana, hematachezia.    PE: GENERAL:  Adult Caucasian female, examined in [facility].  Alert and Appropriate.  In no discomfort; no respiratory distress. HNEENT: PERRLA, extra ocular movement intact, sclera clear, anicteric, oropharynx clear, no lesions and thyroid without masses THORAX: HEART: S1, S2 normal, no murmur, rub or gallop, regular rate and rhythm LUNGS: clear to auscultation, no wheezes or rales and unlabored breathing ABDOMEN:  +BS, soft, protuberant, mild RLQ pain with moderate to deep palp EXTREMITIES: no edema, no erythema, distal pulses intact

## 2010-12-15 NOTE — Assessment & Plan Note (Signed)
Some sx persisting.  Will continue PPI and try to ween bentyl.

## 2010-12-16 LAB — POCT I-STAT, CHEM 8
BUN: 7
Calcium, Ion: 1.17
Creatinine, Ser: 0.8
Glucose, Bld: 100 — ABNORMAL HIGH
TCO2: 25

## 2010-12-17 LAB — CBC
Hemoglobin: 11.7 — ABNORMAL LOW
MCHC: 32.8
RDW: 11.8

## 2010-12-17 LAB — RAPID URINE DRUG SCREEN, HOSP PERFORMED
Barbiturates: NOT DETECTED
Benzodiazepines: POSITIVE — AB
Cocaine: NOT DETECTED
Opiates: NOT DETECTED

## 2010-12-17 LAB — BASIC METABOLIC PANEL
CO2: 23
Calcium: 8.9
Creatinine, Ser: 0.65
Glucose, Bld: 168 — ABNORMAL HIGH
Sodium: 140

## 2010-12-17 LAB — ETHANOL: Alcohol, Ethyl (B): <5

## 2010-12-17 LAB — URINALYSIS, ROUTINE W REFLEX MICROSCOPIC
Bilirubin Urine: NEGATIVE
Hgb urine dipstick: NEGATIVE
Ketones, ur: NEGATIVE
Protein, ur: NEGATIVE
Urobilinogen, UA: 1

## 2010-12-17 LAB — DIFFERENTIAL
Basophils Absolute: 0
Basophils Relative: 0
Monocytes Absolute: 0.5
Neutro Abs: 4.4

## 2010-12-30 ENCOUNTER — Encounter: Payer: Self-pay | Admitting: Sports Medicine

## 2010-12-30 ENCOUNTER — Ambulatory Visit (INDEPENDENT_AMBULATORY_CARE_PROVIDER_SITE_OTHER): Payer: PRIVATE HEALTH INSURANCE | Admitting: Sports Medicine

## 2010-12-30 VITALS — BP 133/99 | HR 98 | Ht 62.0 in | Wt 203.0 lb

## 2010-12-30 DIAGNOSIS — R109 Unspecified abdominal pain: Secondary | ICD-10-CM

## 2010-12-30 DIAGNOSIS — K219 Gastro-esophageal reflux disease without esophagitis: Secondary | ICD-10-CM

## 2010-12-30 DIAGNOSIS — E039 Hypothyroidism, unspecified: Secondary | ICD-10-CM

## 2010-12-30 DIAGNOSIS — K900 Celiac disease: Secondary | ICD-10-CM

## 2010-12-30 NOTE — Patient Instructions (Signed)
It was nice to see you today.  I will call you with any abnormalities in your thyroid function tests.  Please stop the Bentyl and Prilosec.  I will plan on seeing you back in 2-3 months to see how things are going for you.  Keep up the good work!

## 2011-01-06 NOTE — Assessment & Plan Note (Signed)
resolved 

## 2011-01-06 NOTE — Assessment & Plan Note (Signed)
Will recheck thyroid function tests.  TSH is elevated but T3 and T4 WNL.  Will continue to monitor at at this time and recheck full thyroid function tests in 3-6  Months.

## 2011-01-06 NOTE — Assessment & Plan Note (Addendum)
Continue gluten free diet Will Discontinue Bentyl as not helping pts symptoms.

## 2011-01-06 NOTE — Progress Notes (Signed)
Holly Richards presents today for follow up of her GI symptoms and Celiac Disease.  She is doing much better and is ready to return to work.  No longer having pain, some occasional reflux symptoms but much improved.  She is adapting to her modified gluten free diet well.  She is back to her baseline activity level and is using [employer] for up to 5 mins at a time.  Working to increase this.    ROS: Occasional loose stools but no overt diarrhea.  No melana, no hematachezia No dysuria, no hematuria, no hesistancy No LE hip pain, no focal weakness No changes in vision, hearing, smell or taste  PE: GENERAL:  adult Caucasian female, examined in [facility].  Alert and interactive.  In no discomfort; no respiratory distress. HNEENT: PERRLA, extra ocular movement intact, sclera clear, anicteric, oropharynx clear, no lesions and thyroid without masses THORAX: HEART: S1, S2 normal, no murmur, rub or gallop, regular rate and rhythm LUNGS: clear to auscultation, no wheezes or rales and unlabored breathing ABDOMEN:  abdomen is soft without significant tenderness, masses, organomegaly or guarding EXTREMITIES: no swelling, no erythema, distal pulses intact.  >MSK/Osteopathic normal strength, tone, and muscle mass, no deformities, ROM of all joints is normal, negative FABER and FADIR and gait was normal for age

## 2011-01-06 NOTE — H&P (Signed)
[name], Richards NO.:  [id number]  MEDICAL RECORD NO.:  [id number]  LOCATION:  5011                         FACILITY:  [facility]  PHYSICIAN:  Santiago Bumpers. Hensel, M.D.DATE OF BIRTH:  [birth year]  DATE OF ADMISSION:  11/06/2010 DATE OF DISCHARGE:                             HISTORY & PHYSICAL   PRIMARY CARE PHYSICIAN:  Unassigned.  OB/GYN:  Juluis Mire, MD, at [facility].  CHIEF COMPLAINT:  Abdominal and pelvic pain.  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  The patient states that 2-1/2 weeks ago, she had sudden diffuse abdominal pain that made it hard for her to move.  The pain is greater on her right side than her left, but is also felt in her pelvis.  The pain increases with movement and palpation of her abdomen.  She has also had nonbloody emesis.  The patient endorses dysuria, but no hematuria.  The patient was initially seen 2 weeks ago by Urgent Care and was sent to Dr. Arelia Sneddon in OB/GYN.  The patient had a pelvic MRI (because she could not tolerate a pelvic ultrasound given her pain) that did not show any abnormalities.  No masses.  No ovarian torsion.  No thrombus and he suggested that the patient may have interstitial cystitis.  Dr. Arelia Sneddon also suggested consulting with the urologist.  She also had negative gonorrhea/Chlamydia test.  The patient has been seen at [facility] twice in the past few weeks for the same symptoms.  During this hospitalization, she had a normal pelvic and abdominal CT scan. Previous workups also included being treated for bacterial vaginosis twice with metronidazole and treated with ciprofloxacin for presumed UTI.  The patient states she was dismissed by her PCP after not showing up to a physical therapy referral that her PCP had advised she go to. The patient and her  family are visibly upset about recurrence of the patient's pain and irritation with different hospital systems.  PAST MEDICAL HISTORY: 1. 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 2. Hypothyroidism. 3. Mood disorder. 4. Nephrolithiasis. 5. Perimenopause. 6. HPV virus. 7. Hyperlipidemia.  PAST SURGICAL HISTORY: 1. Sinus surgery. 2. In 2008 cholecystectomy. 3. 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  SOCIAL HISTORY:  The patient is married and lives with her husband.  She has no children.  The patient denies tobacco, alcohol, or drug use.  FAMILY HISTORY:  Mother has OCD.  ALLERGIES:  CODEINE per chart.  No known drug allergies per the patient.  MEDICATIONS: 1. Sertraline 100 mg daily. 2. Risperidone 2 mg at bedtime. 3. Trazodone 150 mg p.o. at bedtime.  REVIEW OF SYSTEMS:  Per HPI, otherwise 12-point review of systems was performed and was unremarkable.  PHYSICAL EXAMINATION:  VITAL SIGNS:  Pulse 98, blood pressure 121/86, respiratory rate 20, oxygen 100% on room air, and temperature 98.4. GENERAL:  The patient has Turner body habitus.  Before administration of pain meds:  The patient is extremely uncomfortable and moves around frequently because she  is in pain.  After pain meds:  The patient is much calmer. HEENT:  Moist mucous membranes. HEART:  Tachycardic rate, regular rhythm.  No murmurs, rubs, or gallops. LUNGS:  Clear to auscultation bilaterally.  No wheezes, rales, or rhonchi. ABDOMEN:  (done post-pain meds).  Normal bowel sounds.  Soft to protuberant abdomen, tender to palpation diffusely, but with distraction, pain is less with palpation.  Some guarding, but this is suppressible.  The patient does have rebound; however, four seconds after my hand was removed, the patient flinched.  She did not have pain when I was jostling the bed. EXTREMITIES:  No peripheral edema.  LABS AND IMAGING:  CBC; white blood  count 10.6, hemoglobin 14.9, hematocrit 42.9, and platelets 246.  BMP; sodium 136, potassium 4.1, chloride 101, CO2 is 21, BUN 6, creatinine 0.48, and glucose 107.  UPT was negative.  UA bland, pH of 8.0, otherwise within normal limits.  Lipase 32.  Abdominal ultrasound, no acute findings identified, status post cholecystectomy.  Fatty infiltration of the liver.  ASSESSMENT AND PLAN:  [name] is a 31 year old female with turner syndrome and hypothyroidism presenting with severe abdominal and pelvic pain. 1. Abdominal/pelvic pain, uncertain etiology.  She has had an     extensive workup so far which has not shown a cause for her pain.     GYN suspects interstitial cystitis as the cause and recommends a     urology consult.  She does have a mild elevation of her liver     enzymes; however, this is consistent with a fatty liver infiltrate     seen on ultrasound.  I do not think this is the cause for her pain.     Continue workup with venous lactate to assess for bowel ischemia     and an upright x-ray to assess for any acute findings such as free     air or constipation.  We will repeat CBC and CMP in the a.m.  We     will treat pain with oxycodone and nausea with Zofran. 2. Hypothyroidism, uncertain, noted on past notes.  The patient is not     on Synthroid.  We will check TSH. 3. Mood disorder, uncertain, but is severe in the past as noted with a     psych admission in 2009.  On Risperdal, sertraline, and trazodone.     We will continue home meds and follow. 4. Fluids, electrolytes, nutrition/gastrointestinal.  Saline lock IV     diet and nausea medications.  If unable to     tolerate diet, we will provide IV fluids. 5. Prophylaxis.  Lovenox 40 mg daily and Protonix 40 mg daily. 6. Disposition when workup is completed and symptoms resolved.     Clementeen Graham, MD   ______________________________ Santiago Bumpers Leveda Anna, M.D.    EC/MEDQ  D:  11/06/2010  T:  11/07/2010  Job:   [id number]  cc:   Juluis Mire, M.D.  Electronically Edited and Signed  By Clementeen Graham  on 01/02/2011 07:42:48 AM Electronically Signed by Doralee Albino M.D. on 01/06/2011 11:03:32 AM

## 2011-01-06 NOTE — Assessment & Plan Note (Signed)
Symptoms improving.  Will stop Prilosec and continue Protonix for total of 3 months.

## 2011-01-25 ENCOUNTER — Telehealth: Payer: Self-pay | Admitting: Family Medicine

## 2011-01-25 NOTE — Telephone Encounter (Signed)
Pt calling stating she has some bronchitis and wants to know what to do.  Told her if concern would go to urgent care and be checked out Pt denies fever but has some chills, no shortness of breath no chest pain.  Very anxious.  No red flags sent to urgent care for evaluation.

## 2011-02-03 ENCOUNTER — Ambulatory Visit (INDEPENDENT_AMBULATORY_CARE_PROVIDER_SITE_OTHER): Payer: PRIVATE HEALTH INSURANCE | Admitting: Family Medicine

## 2011-02-03 ENCOUNTER — Encounter: Payer: Self-pay | Admitting: Family Medicine

## 2011-02-03 DIAGNOSIS — R109 Unspecified abdominal pain: Secondary | ICD-10-CM

## 2011-02-03 MED ORDER — PANTOPRAZOLE SODIUM 40 MG PO TBEC: 40.0000 mg | DELAYED_RELEASE_TABLET | Freq: Every day | ORAL | Status: DC

## 2011-02-03 MED ORDER — TRAMADOL HCL 50 MG PO TABS: 50.0000 mg | ORAL_TABLET | Freq: Four times a day (QID) | ORAL | Status: DC | PRN

## 2011-02-03 NOTE — Assessment & Plan Note (Signed)
Recurrent abdominal pain likely due to underlying celiac disease. No evidence of infectious or inflammatory component such as pancreatitis or gallbladder disease..  I agreed to refill a short course of tramadol and discussed with her that I would not refill Bentyl today as it is not typically used to treat celiac disease. I discussed with her the need to follow with her PCP to discuss ongoing management of her gastrointestinal complaints. I offered her referral for nutrition counseling to help you with compliance with possible inadvertent gluten ingestion. She will followup with her PCP to discuss pain and need for GI referral

## 2011-02-03 NOTE — Progress Notes (Signed)
  Subjective:    Patient ID: Holly Richards, female    DOB: 10/11/1980, 30 y.o.   MRN: [id number]  HPI 30 year old presents today for a same-day appointment for evaluation of abdominal pain  Patient has a history of recently diagnosed celiac disease. She has just returned to work in the past 1-2 weeks which is the timeframe for which she has been noting increasing abdominal pain. She states the pain is mild and describes it as an "annoyance"  she takes tramadol at times at work. She requests a refill of this in her Protonix. She inquires about refilling Bentyl as she feels like her symptoms got worse after discontinuing Bentyl.  She reports soft stools but no diarrhea, flatulence, bloating. She reports no significant nausea or emesis. She states she continues to do well with her low gluten diet and has not had any change in her eating habits since symptoms started up again 1-2 weeks ago    Review of Systems Please see history of present illness    Objective:   Physical Exam  GEN: Alert & Oriented, No acute distress CV:  Regular Rate & Rhythm, no murmur Respiratory:  Normal work of breathing, CTAB Abd:  + BS, soft, no tenderness to palpation Ext: no pre-tibial edema       Assessment & Plan:

## 2011-02-03 NOTE — Patient Instructions (Signed)
I have refilled your tramadol and Protonix. They have been sent here [facility].  Please make a followup with Dr. Berline Chough to discuss needs for continued treatment for your celiac disease.  Consider nutrition referral to help control symptoms.

## 2011-03-05 ENCOUNTER — Ambulatory Visit (INDEPENDENT_AMBULATORY_CARE_PROVIDER_SITE_OTHER): Payer: PRIVATE HEALTH INSURANCE | Admitting: Sports Medicine

## 2011-03-05 ENCOUNTER — Encounter: Payer: Self-pay | Admitting: Sports Medicine

## 2011-03-05 VITALS — BP 136/94 | HR 92 | Temp 97.5°F | Ht 63.0 in | Wt 191.3 lb

## 2011-03-05 DIAGNOSIS — E669 Obesity, unspecified: Secondary | ICD-10-CM

## 2011-03-05 DIAGNOSIS — K900 Celiac disease: Secondary | ICD-10-CM

## 2011-03-05 DIAGNOSIS — R109 Unspecified abdominal pain: Secondary | ICD-10-CM

## 2011-03-05 DIAGNOSIS — K219 Gastro-esophageal reflux disease without esophagitis: Secondary | ICD-10-CM

## 2011-03-05 DIAGNOSIS — Z13220 Encounter for screening for lipoid disorders: Secondary | ICD-10-CM

## 2011-03-05 LAB — LIPID PANEL
HDL: 31 mg/dL — ABNORMAL LOW (ref >39)
Triglycerides: 416 mg/dL — ABNORMAL HIGH (ref <150)

## 2011-03-05 NOTE — Assessment & Plan Note (Addendum)
Patient has had no weight loss since diagnosis of celiac disease. She has also been decreasing her sugar consumption and substituting the diet drinks. Encourage her to continue this habit and also try to decrease all artificially sweetened drinks as this can be contributing to some of her gastroesophageal reflux disease. We will also work on establishing a regularly scheduled diet  throughout the day. We'll check a lipid panel today to screen for hyperlipidemia and hypertriglyceridemia. Recheck hemoglobin A1c at next visit in March or April

## 2011-03-05 NOTE — Progress Notes (Signed)
Patient ID: [name], female   DOB: 1980-12-05, 30 y.o.   MRN: [id number] Subjective:  Holly Richards is a 30 y.o. female presenting today for followup of her celiac disease. She reports her well her gluten-free diet is going very well that she's made modifications to her sugar intake as well by substituting diet drinks intoeing back on her total intake of sweets. She has had a 20 pound weight loss in the past 4 months and is concerned this could potentially be leading to some immune dysfunction however overall feels much better. She does report having the flu twice she has had positive influenza swabs documented at urgent care. She received Tamiflu for the first round and is reported to have an allergic reaction requiring Benadryl and a steroid injection. Second round of influenza positive illness she was given Relenza responded well to this. She does report having a flu shot this season.   ROS: Patient reports decrease nausea, no vomiting, decreased abdominal pain. The shortness breath, no coughing currently. 2 viral illnesses both influenza positive in the last 2 months. Right-sided pain is improved. No decreased range of motion Bowel habits are unchanged, patient still has 3-4 loose bowel movements per day but is unchanged since she had her gallbladder removed. Denies hematochezia or melena.    PE: GENERAL:  Adult Caucasian female examined in [facility]. Alert and appropriately interactive. In no acute discomfort, no respiratory distress.  HNEENT: Atraumatic normocephalic, moist mucous membranes, no scleral icterus, slightly pale conjunctiva, trachea midline, no thyromegaly, no thyroid nodules. THORAX: HEART:  S1-S2 heard, no murmur, regular rate and rhythm  LUNGS: clear to auscultation bilaterally ABDOMEN:  Positive bowel sounds soft nontender, no masses no organomegaly, well-healed surgical incisions over epigastrium and right flank.  EXTREMITIES: Warm well perfused, 2+ out of 4 peripheral  pulses, no edema. >PSYCH: Appropriate affect, attentive to conversation, denies any worsening mood or concerns regarding her mental well-being. Does seem overly preoccupied with her gluten-free diet, reports that her family member that also has celiac disease is unable to tolerate even touching a tray with gluten-containing foods on it and is moderately distressed this may be the case with her.

## 2011-03-05 NOTE — Assessment & Plan Note (Signed)
Patient reports that her gastroesophageal  reflux is much improved. We will try to wean her PPI as tolerated. If symptoms return will reinitiate treatment.

## 2011-03-05 NOTE — Assessment & Plan Note (Signed)
Much improved, she has been tolerating a gluten-free diet very well. No changes to be made at this time

## 2011-03-05 NOTE — Patient Instructions (Signed)
It was great to see you today, I am glad to hear that your diet is going well.  You have lost 20 pounds over the past 4 months which is about a pound a week. This is a great change and not too excessive at this time. One thing I would like for you to do is try to schedule your meals on a more regular basis.  This will help regulate your metabolism and allow you to eat more.  Please try weaning your Protonix and your Tramadol if you feel like you're symptoms are well-controlled. If you have any return of your reflux or your abdominal pain you're more than welcome to go back on these medications at your discretion.  I like to see you back in March or April to see how you're doing and to also recheck blood work at that time. We will check a cholesterol level today and I'll call you the results are abnormal and we need to consider adding any medications.  Have a great holiday season and will see you in new year.

## 2011-03-05 NOTE — Assessment & Plan Note (Signed)
Much improved having only occasional pain while at work. She does still have some tramadol that she takes maybe 2 times per week. We will try to wean from her tramadol. Tylenol when necessary

## 2011-04-18 ENCOUNTER — Ambulatory Visit (INDEPENDENT_AMBULATORY_CARE_PROVIDER_SITE_OTHER): Payer: PRIVATE HEALTH INSURANCE | Admitting: Sports Medicine

## 2011-04-18 ENCOUNTER — Encounter: Payer: Self-pay | Admitting: Sports Medicine

## 2011-04-18 VITALS — BP 118/82 | HR 76 | Temp 98.6°F | Ht 63.0 in | Wt 192.5 lb

## 2011-04-18 DIAGNOSIS — J069 Acute upper respiratory infection, unspecified: Secondary | ICD-10-CM

## 2011-04-18 MED ORDER — BENZONATATE 100 MG PO CAPS: 100.0000 mg | ORAL_CAPSULE | Freq: Two times a day (BID) | ORAL | Status: AC | PRN

## 2011-04-18 NOTE — Patient Instructions (Signed)
It was great to see you today. I'm sorry you have  not been feeling well the last few days.  I believe that you have a common cold. The treatment for this at this time is supportive care only. There is no antibiotic is indicated at this time to help your symptoms.  For symptomatic relief follow the following regimen:  Continue Nyquil Cold and Flu at Night  Pick Up Tessalon prescription at your pharmacy for cough relief.  Take this before bed  Pick up Pseudoephedrine Over the Counter and take  60mg  by mouth every 4 to 6 hours as needed for congestion.  I would not take this around the time you are trying to go to bed as it may keep you up.  Use over the counter nasal spray 3-4 times per day to help with your congestion.  I would like to see you back at either the end of the month or the beginning of next month.  I would like to repeat some labs at that time and we will need you to have not eaten anything in the 8 hours prior to you lab draw.  We can do that if you are able to make a morning appointment or you may come in a morning before your appointment to have this done.

## 2011-04-18 NOTE — Progress Notes (Signed)
Holly Richards is a 31 y.o. female who complains of congestion, sore throat, nasal blockage, infrequent dry cough, myalgias and bilateral sinus pain for 3 days. She denies a history of dizziness, fatigue, fevers, shortness of breath, weakness and sputum production and has a history of asthma. Patient does not smoke cigarettes.   OBJECTIVE: She appears well, vital signs are as noted. Ears normal.  Throat and pharynx normal.  Neck supple. No adenopathy in the neck. Nose is congested. Sinuses non tender. The chest is clear, without wheezes or rales.  ASSESSMENT:  viral upper respiratory illness  PLAN: Symptomatic therapy suggested: push fluids, rest, use acetaminophen, antihistamine-decongestant of choice, cough suppressant of choice, Sudafed prn and return office visit prn if symptoms persist or worsen. Lack of antibiotic effectiveness discussed with her. Call or return to clinic prn if these symptoms worsen or fail to improve as anticipated.

## 2011-04-24 ENCOUNTER — Encounter: Payer: Self-pay | Admitting: Family Medicine

## 2011-04-24 ENCOUNTER — Ambulatory Visit (INDEPENDENT_AMBULATORY_CARE_PROVIDER_SITE_OTHER): Payer: PRIVATE HEALTH INSURANCE | Admitting: Family Medicine

## 2011-04-24 ENCOUNTER — Telehealth: Payer: Self-pay | Admitting: Sports Medicine

## 2011-04-24 VITALS — BP 129/96 | HR 84 | Temp 98.1°F | Ht 63.0 in | Wt 186.7 lb

## 2011-04-24 DIAGNOSIS — J9801 Acute bronchospasm: Secondary | ICD-10-CM

## 2011-04-24 DIAGNOSIS — J45909 Unspecified asthma, uncomplicated: Secondary | ICD-10-CM

## 2011-04-24 DIAGNOSIS — Q969 Turner's syndrome, unspecified: Secondary | ICD-10-CM | POA: Insufficient documentation

## 2011-04-24 MED ORDER — ALBUTEROL SULFATE HFA 108 (90 BASE) MCG/ACT IN AERS: INHALATION_SPRAY | RESPIRATORY_TRACT | Status: DC

## 2011-04-24 MED ORDER — AMOXICILLIN-POT CLAVULANATE 875-125 MG PO TABS: 1.0000 | ORAL_TABLET | Freq: Two times a day (BID) | ORAL | Status: AC

## 2011-04-24 MED ORDER — PREDNISONE 20 MG PO TABS: 40.0000 mg | ORAL_TABLET | Freq: Every day | ORAL | Status: AC

## 2011-04-24 MED ORDER — FLUTICASONE PROPIONATE 50 MCG/ACT NA SUSP: NASAL | Status: DC

## 2011-04-24 MED ORDER — IPRATROPIUM BROMIDE 0.02 % IN SOLN
0.5000 mg | Freq: Once | RESPIRATORY_TRACT | Status: AC
  Administered 2011-04-24: 0.5 mg via RESPIRATORY_TRACT

## 2011-04-24 MED ORDER — GUAIFENESIN-CODEINE 100-10 MG/5ML PO SOLN: 5.0000 mL | Freq: Three times a day (TID) | ORAL | Status: AC | PRN

## 2011-04-24 MED ORDER — ALBUTEROL SULFATE (5 MG/ML) 0.5% IN NEBU
2.5000 mg | INHALATION_SOLUTION | Freq: Once | RESPIRATORY_TRACT | Status: AC
  Administered 2011-04-24: 2.5 mg via RESPIRATORY_TRACT

## 2011-04-24 NOTE — Patient Instructions (Addendum)
We will treat you for possible bacterial sinusitis with bronchospasm exacerbation.  Start Augmentin antibiotic one pill twice a day for 5 days  Use the Albuterol inhaler for shortness or breath, coughing or wheezing.  Start Flonase steroid nasal inhaler, 2 sprays into each nostril daily for next month to help with nasal congestion.   If your breathing worsens, then start the prednisone 40 mg daily for seven days.  I would recommend staying home from work, if possible, for next two days.  Rest, drink plenty of fluids.  Use the Cough medicine as needed  Return on Monday for recheck if not improving.

## 2011-04-24 NOTE — Telephone Encounter (Signed)
Patient states she started wheezing last night. Has difficulty lying flat , feels like she can't breath. Had to sleep in recliner. Productive cough, yellow sputum. She can be here in 30 minutes . Appointment scheduled for 10:30

## 2011-04-24 NOTE — Telephone Encounter (Signed)
Patient is calling because she had seen Dr. Berline Chough last Friday and Dx'd with a common cold, she isn't feeling any better and she is wheezing that I can hear while on the phone with her.  The meds he prescribed is not working.  She needs advice as to what she needs to do.

## 2011-04-24 NOTE — Progress Notes (Signed)
  Subjective:    Patient ID: Holly Richards, female    DOB: June 07, 1980, 31 y.o.   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 Short of breath, particularly when lying down. Nasal congestion.  No post-tussive emesis.  Has been going to work at night.  Took off a couple days of work last week.  Is concerned that she will lose her job if she misses any more work.  Hx of asthma: Prior PCP had pt on Symbicort.  Sinusitis Risk Factors Fever: no   Headache/face pain: yes, over checks and around eyes.   Double sickening: yes  Tooth pain: no   Allergy Risk Factors: Sneezing: no  Itchy scratchy throat: no  Seasonal sx: no   Flu Risk Factors Headache: yes  Muscle aches: no  Severe fatigue: no    Red Flags  Stiff neck: no  Dyspnea: yes  Swallowing difficulty: yes, due to sore throat from coughing   Review of Systems     Objective:   Physical Exam Peak flow before albuterol/atrovent 150 l/min ----> 280 L/min after treatment. General; Mild distress.  Talking in full sentences but interrupted frequently by paroxysms of coughing. Heent: bilateral old scarring of TM, oropharnyx without erythema or edema. No cervical LAN, (+) tender to palp over maxillary sinuses bilaterally. No nasal draininge.  Lungs: Late diffuse expriatory wheezing diffusely.  No crackles.  No acc mm use.  Cor: RRR, No MGR. No JVD     Assessment & Plan:

## 2011-04-24 NOTE — Assessment & Plan Note (Addendum)
Given severity of facial pain, duration and progression of symptoms, and associated bronchospasm findings including reduced Peak Flow, Will treat with Augmentin 875 mg BID for 5 days for possible acute bacterial sinusitis.    Additional therapies started:  Flonase 2 spray each nostril daily for one month, Robitussin AC prn cough.  Pt given prescription for albuterol MDI.  Encourgaed to use her Symbicort twice a day.    Rx for Prednisone 40 mg dialy for 7 days for patient to self-initiate if symptoms worsens.    Encouraged patient to remain home from work for rest for next 2 days.   RTC on Monday if not improved.

## 2011-04-28 ENCOUNTER — Ambulatory Visit: Payer: PRIVATE HEALTH INSURANCE | Admitting: Family Medicine

## 2011-05-12 ENCOUNTER — Other Ambulatory Visit: Payer: PRIVATE HEALTH INSURANCE

## 2011-05-15 ENCOUNTER — Ambulatory Visit: Payer: PRIVATE HEALTH INSURANCE | Admitting: Sports Medicine

## 2011-05-19 ENCOUNTER — Telehealth: Payer: Self-pay | Admitting: Sports Medicine

## 2011-05-19 NOTE — Telephone Encounter (Signed)
Need to discuss follow up visit and [facility].

## 2011-05-22 MED ORDER — LEVOTHYROXINE SODIUM 75 MCG PO TABS: 75.0000 ug | ORAL_TABLET | Freq: Every day | ORAL | Status: DC

## 2011-05-22 NOTE — Telephone Encounter (Signed)
Sorry she missed her appointment for blood work; she was in hospital at [facility].  Found to be Hypothyroid - started on Levothyroxine qday  Requested to follow up in 6 weeks for recheck.  Will call to make appointment.

## 2011-08-05 ENCOUNTER — Ambulatory Visit: Payer: PRIVATE HEALTH INSURANCE | Admitting: Sports Medicine

## 2011-08-22 ENCOUNTER — Ambulatory Visit: Payer: PRIVATE HEALTH INSURANCE | Admitting: Sports Medicine

## 2011-09-25 ENCOUNTER — Ambulatory Visit: Payer: PRIVATE HEALTH INSURANCE | Admitting: Sports Medicine

## 2011-10-30 ENCOUNTER — Ambulatory Visit: Payer: PRIVATE HEALTH INSURANCE | Admitting: Sports Medicine

## 2011-11-10 ENCOUNTER — Telehealth: Payer: Self-pay | Admitting: Family Medicine

## 2011-11-10 NOTE — Telephone Encounter (Signed)
Patient called the emergency line reporting a few days of sore throat, hoarseness, facial pressure, nasal congestion. She gets this multiple times per year and it is typically treated with Z-pak. Patient is not requesting medication but would like to know if she should come to clinic and to see if she can go to work. 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 Encouraged patient to use over the counter Mucinex, saline nasal spray and Tylenol as needed, and come to our clinic to be seen if symptoms worsen or fail to improve in a week. Able to return to work as long as she is afebrile and feels well enough to do her job.  Kaliann Coryell M. Sabino Denning, M.D. 11/10/2011 9:32 PM

## 2012-01-09 ENCOUNTER — Telehealth: Payer: Self-pay | Admitting: *Deleted

## 2012-01-09 ENCOUNTER — Telehealth: Payer: Self-pay | Admitting: Sports Medicine

## 2012-01-09 NOTE — Telephone Encounter (Signed)
Spoke with patient . See next telephone note.

## 2012-01-09 NOTE — Telephone Encounter (Signed)
Patient calls stating she feels  extremely hot all the time. This started 4 days ago.  She sleeps with 4 fans on her. She is sitting outside now in a tank top and shorts and is still hot.   Any exertion and she sweats profusely.  States she has not taken thyroid medication for over a year.  Synthroid is on med list and it is noted she has had abnormal TSH in past.  Will send message to Dr. Berline Chough.

## 2012-01-09 NOTE — Telephone Encounter (Signed)
Patient is calling to speak to the nurse about something personal that she did not want to discuss over the phone.

## 2012-01-09 NOTE — Telephone Encounter (Signed)
Pt calls and reports that she is having profuse sweating and cannot stop no matter what she has tried.  Has been using fans and taking cold showers.  House is on 65 degress an using 2 fans.  Hx of hypothyroidism, not on medications.  ?thyroid storm currently.  No red flags.  Some mild orthostasis.  Encouraged pt to be evaluated either in ED or Urgent care if any presyncope/syncope, tachycardia > 140 or febrile to >102.5oF.   Pt to follow up with me ASAP if otherwise stable.  Will call Monday for same day appointment if needed

## 2012-01-13 ENCOUNTER — Encounter: Payer: Self-pay | Admitting: Sports Medicine

## 2012-01-13 ENCOUNTER — Ambulatory Visit (INDEPENDENT_AMBULATORY_CARE_PROVIDER_SITE_OTHER): Payer: PRIVATE HEALTH INSURANCE | Admitting: Sports Medicine

## 2012-01-13 ENCOUNTER — Other Ambulatory Visit: Payer: Self-pay | Admitting: Family Medicine

## 2012-01-13 VITALS — BP 134/91 | HR 87 | Temp 97.6°F | Ht 62.0 in | Wt 204.6 lb

## 2012-01-13 DIAGNOSIS — F411 Generalized anxiety disorder: Secondary | ICD-10-CM

## 2012-01-13 DIAGNOSIS — R61 Generalized hyperhidrosis: Secondary | ICD-10-CM

## 2012-01-13 DIAGNOSIS — E669 Obesity, unspecified: Secondary | ICD-10-CM

## 2012-01-13 DIAGNOSIS — Q969 Turner's syndrome, unspecified: Secondary | ICD-10-CM

## 2012-01-13 DIAGNOSIS — K900 Celiac disease: Secondary | ICD-10-CM

## 2012-01-13 DIAGNOSIS — F419 Anxiety disorder, unspecified: Secondary | ICD-10-CM

## 2012-01-13 DIAGNOSIS — F319 Bipolar disorder, unspecified: Secondary | ICD-10-CM

## 2012-01-13 NOTE — Patient Instructions (Addendum)
We will check labs today. Please follow up with Dr. Evelene Croon. I would like to see you back in 2-3 weeks.  Watch a movie called "[employer]"  Here are some basic nutrition rules to remember:  "Eat Real Foods & Drink Real Drinks" - if you think it was made in a factory . . it is likely best to avoid it as a staple in your diet.  Limiting these types of foods to 1-2 times per week is a good idea.  Sticking with fresh fruits and vegetables as well as home cooked meals will typically provide more nutrition and less salt than prepackaged meals.     Limit the amount of sugar sweetened and artificially sweetened foods and beverages.  Sticking with water flavored with a slice of lemon, lime or orange is a great option if you want something with flavor in it.  Using flavored seltzer water to flavor plain water will also add some bite if you want something more than flavor.     Here are 2 of my favorite web sites that provide great nutrition and exercise advice.   www.eats[place]movemore[place].com www.choosemyplate.gov

## 2012-01-13 NOTE — Progress Notes (Signed)
  [facility]  Patient name: Holly Richards MRN [id number]  Date of birth: [birth year]  CC & HPI:  Holly Richards is a 31 y.o. female presenting today for complete physical exam:  # Reports Jitteriness, sweating, hair loss, nail thinning and profound heat intolerance X 1 week.  Some tachypnea.  Trying to stay cool helps but not sleeping well.  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  Has had return of weight gain.  # PSYCH: followed by psychiatry and reports she is doing well on current treatment (risperidone, sertraline, trazodone) for her mood disorder.     ROS:  Per HPI  Pertinent History Reviewed:  Medical & Surgical Hx:  Reviewed: Significant for Celiac disease s/p EGD Medications: Reviewed & Updated - see associated section Social History: Reviewed -  reports that she has never smoked. She does not have any smokeless tobacco history on file.   Objective Findings:  Vitals:  Filed Vitals:   01/13/12 0928  BP: 134/91  Pulse: 87  Temp: 97.6 F (36.4 C)    PE: GENERAL:  Adult obese [place] female. In mild discomfort; no respiratory distress, mildly diaphoretic.   PSYCH: Alert and appropriately interactive; Insight:Good   H&N: AT/[place], trachea midline, mild thyromegaly, without nodularity EENT:  MMM, no scleral icterus, EOMi HEART: RRR, S1/S2 heard, no murmur LUNGS: CTA B, no wheezes, no crackles EXTREMITIES: Moves all 4 extremities spontaneously, warm well perfused, no edema, bilateral DP and PT pulses 2/4.      Assessment & Plan:

## 2012-01-15 ENCOUNTER — Telehealth: Payer: Self-pay | Admitting: Sports Medicine

## 2012-01-15 DIAGNOSIS — R61 Generalized hyperhidrosis: Secondary | ICD-10-CM | POA: Insufficient documentation

## 2012-01-15 DIAGNOSIS — F419 Anxiety disorder, unspecified: Secondary | ICD-10-CM | POA: Insufficient documentation

## 2012-01-15 DIAGNOSIS — F39 Unspecified mood [affective] disorder: Secondary | ICD-10-CM | POA: Insufficient documentation

## 2012-01-15 DIAGNOSIS — F319 Bipolar disorder, unspecified: Secondary | ICD-10-CM | POA: Insufficient documentation

## 2012-01-15 NOTE — Telephone Encounter (Addendum)
Spoke with patient who reports persistent heat intolerance.  Confirms heat intolerance is constant with only minor worsening on occasion.  Unsure of etiology given normal T3 and elevated TSH.  Will consider further hypothalamic workup and consider hypothalamic imaging if pan elevation of TSH, FSH, LH, Prolactin

## 2012-01-15 NOTE — Assessment & Plan Note (Signed)
Unclear if etiology of heat intolerance may be associated with syndrome.  Consider vasomotor symptoms but unsure if this is possible

## 2012-01-15 NOTE — Addendum Note (Signed)
Addended by: Gaspar Bidding D on: 01/15/2012 02:25 PM   Modules accepted: Level of Service

## 2012-01-15 NOTE — Assessment & Plan Note (Signed)
Continue gluten free diet.  

## 2012-01-15 NOTE — Assessment & Plan Note (Signed)
Unclear etiology.  ?Hyperthyroid.  Will Check TSH and free T3.   Pt not tachycardic.   >Will treat with methimazole if Hyper and needs thyroid scan > if thyroid okay consider mild serotonin syndrome from psych meds

## 2012-01-15 NOTE — Assessment & Plan Note (Signed)
Discussed eating whole foods.  Watch portion sizes

## 2012-01-15 NOTE — Assessment & Plan Note (Signed)
To follow up with Psych.  Potential risk of heat intolerance being mild serotonin syndrome.  No tachycardia or other systemic signs. Consider stopping Trazodone and Zoloft if heat intolerance is  not improved

## 2012-01-22 ENCOUNTER — Telehealth: Payer: Self-pay | Admitting: Sports Medicine

## 2012-01-22 NOTE — Telephone Encounter (Signed)
Patient is calling to speak to the nurse because she had an episode at work last night and she would like to talk it over with the nurse.

## 2012-01-22 NOTE — Telephone Encounter (Signed)
Patient reports last night at work she passed out, hit her head. Was taken to ED . Started vomiting while at ED. CT was done. Was diagnosed with virus and dehydration.    Was given zofran for nausea and hydrocodone for pain.  She is drinking well . Not able to eat yet.   She is very concerned about what is going on with her ( see previous notes recently) and regarding thyroid.. Dr. Janeece Riggers  first available appointment is 11/18. Scheduled appointment tomorrow with Dr. Louanne Belton .

## 2012-01-23 ENCOUNTER — Encounter: Payer: Self-pay | Admitting: Family Medicine

## 2012-01-23 ENCOUNTER — Ambulatory Visit (INDEPENDENT_AMBULATORY_CARE_PROVIDER_SITE_OTHER): Payer: PRIVATE HEALTH INSURANCE | Admitting: Family Medicine

## 2012-01-23 ENCOUNTER — Telehealth: Payer: Self-pay | Admitting: Family Medicine

## 2012-01-23 VITALS — BP 145/112 | HR 92

## 2012-01-23 DIAGNOSIS — R42 Dizziness and giddiness: Secondary | ICD-10-CM

## 2012-01-23 LAB — GLUCOSE, CAPILLARY: Glucose-Capillary: 107 mg/dL — ABNORMAL HIGH (ref 70–99)

## 2012-01-23 MED ORDER — ONDANSETRON 4 MG PO TBDP: 4.0000 mg | ORAL_TABLET | Freq: Three times a day (TID) | ORAL | Status: DC | PRN

## 2012-01-23 MED ORDER — TRAZODONE HCL 150 MG PO TABS: 75.0000 mg | ORAL_TABLET | Freq: Every day | ORAL | Status: DC

## 2012-01-23 MED ORDER — MECLIZINE HCL 32 MG PO TABS: 32.0000 mg | ORAL_TABLET | Freq: Three times a day (TID) | ORAL | Status: DC | PRN

## 2012-01-23 NOTE — Telephone Encounter (Signed)
Pt was seen in clinic today for syncope and dizziness at work.  Was started on meclizine because diagnosed with vertigo. Pt was calling because Dr. Louanne Belton discussed FMLA with her today during the appt.  Pt talked with her boss and he is ok with her taking the FMLA.  Pt wants to know how to go about getting this done.  Advised pt to call on Monday to discuss with Dr. Louanne Belton or PCP and that she will need to bring paperwork to be filled out by one of those two doctors. Pt was very appreciative.

## 2012-01-23 NOTE — Progress Notes (Signed)
Patient ID: Holly Richards, female   DOB: October 09, 1980, 31 y.o.   MRN: [id number] Subjective: The patient is a 31 y.o. year old female who presents today for syncope.  Patient reports syncopal episode 2 nights ago at work.  Was taken to ED and diagnosed with viral illness with dehydration.  (Pt reports EKG and head CT were normal).  Now with continued pre-syncope with standing.  Nausea/vomiting since after fall 2 nights ago.  Is occuring after everything she takes by mouth.  Patient continues to have problems with feeling of room spinning and seeing double.  Having lots of problem with motion sickness.  Patient has also been having bad headache since then which she has been given norco for.  This is possibly causing nausea to be worse.  Zofran was given in ED and helps with nausea.  Patient also reports continued heat intolerance.  This has not improved since her visit with her PCP.  Patient's past medical, social, and family history were reviewed and updated as appropriate. History  Substance Use Topics  . Smoking status: Never Smoker   . Smokeless tobacco: Not on file  . Alcohol Use: Not on file   Objective:  Filed Vitals:   01/23/12 1052  BP: 145/112  Pulse: 92   Gen: Mild distress, moving very slowly, not wanting to make any sudden movements HEENT: Nystagmas with all eye movements.  Gaze is intermittently disconjugate.  Sudden head movements increase symptoms so pt was not tolerant of [place] CV: RRR, no murmurs Resp: CTABL  Assessment/Plan:  Please also see individual problems in problem list for problem-specific plans.

## 2012-01-23 NOTE — Patient Instructions (Signed)
It was good to see you today! I want you to start taking meclizine for your dizziness.  Take it 3 times daily. I am also referring you to our vestibular rehab program.

## 2012-01-29 ENCOUNTER — Telehealth: Payer: Self-pay | Admitting: Sports Medicine

## 2012-01-29 NOTE — Addendum Note (Signed)
Addended by: Gaspar Bidding D on: 01/29/2012 06:49 AM   Modules accepted: Level of Service

## 2012-01-29 NOTE — Telephone Encounter (Signed)
Spoke with patient and she states that the rehab has not called her yet and that she does feel like she is able to go back to work on Monday. She also has an appointment with Dr. Louanne Belton on Tuesday and is hoping to have the FMLA paperwork by then so that can get statrted

## 2012-01-29 NOTE — Telephone Encounter (Signed)
Patient is calling because she expected Dr. Louanne Belton to call her about her work situation.  She has not been able to get in touch with her FMLA Department through work, so she doesn't have the paperwork yet.  The note that was given to her to excuse her from work, will have her going back on Monday and she wants to know if Dr. Louanne Belton feels like she is ready.  If not, then she needs another note to excuse her from work.  I let her know that he is in clinic this morning.

## 2012-01-30 NOTE — Telephone Encounter (Signed)
Needs a note stating that she can go back to work on Monday - pls fax along with FMLA papers (fax # on papers)

## 2012-01-30 NOTE — Telephone Encounter (Signed)
Spoke with patient and informed her that I would fax the letter out stating that she can go back to work on Monday. I informed her that the FMLA forms have been received and they would not be done today. I did get the fax number to send letter out. AttnReyes Ivan fax #: [phone].

## 2012-02-02 ENCOUNTER — Encounter: Payer: Self-pay | Admitting: Family Medicine

## 2012-02-02 DIAGNOSIS — R42 Dizziness and giddiness: Secondary | ICD-10-CM | POA: Insufficient documentation

## 2012-02-02 NOTE — Assessment & Plan Note (Signed)
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  Will rx meclazine and refer to vestibular rehab.

## 2012-02-03 ENCOUNTER — Encounter: Payer: Self-pay | Admitting: Sports Medicine

## 2012-02-03 ENCOUNTER — Ambulatory Visit (INDEPENDENT_AMBULATORY_CARE_PROVIDER_SITE_OTHER): Payer: PRIVATE HEALTH INSURANCE | Admitting: Sports Medicine

## 2012-02-03 VITALS — BP 135/82 | HR 86 | Temp 97.8°F | Ht 62.0 in | Wt 206.0 lb

## 2012-02-03 DIAGNOSIS — E669 Obesity, unspecified: Secondary | ICD-10-CM

## 2012-02-03 DIAGNOSIS — R61 Generalized hyperhidrosis: Secondary | ICD-10-CM

## 2012-02-03 DIAGNOSIS — F319 Bipolar disorder, unspecified: Secondary | ICD-10-CM

## 2012-02-03 DIAGNOSIS — R42 Dizziness and giddiness: Secondary | ICD-10-CM

## 2012-02-03 DIAGNOSIS — K900 Celiac disease: Secondary | ICD-10-CM

## 2012-02-03 MED ORDER — RISPERIDONE 1 MG PO TABS: 1.0000 mg | ORAL_TABLET | Freq: Every day | ORAL | Status: DC

## 2012-02-03 NOTE — Assessment & Plan Note (Signed)
Improved with med changes per psych Likely some serotonin syndrome component >continue to monitor, consider Risperidone change if continues

## 2012-02-03 NOTE — Assessment & Plan Note (Signed)
Continue gluten free diet Encourage "whole food" gluten free

## 2012-02-03 NOTE — Assessment & Plan Note (Signed)
Not contacted by PT yet Plans to participate

## 2012-02-03 NOTE — Progress Notes (Signed)
  [facility]  Patient name: Holly Richards MRN [id number]  Date of birth: 1980-10-31  CC & HPI:  Holly Richards is a 31 y.o. female presenting today for follow up of:  # Celiac Disease -  and doing well, following a gluten free diet.  Does not have any additional abdominal pain.    # Diaphoresis and heat intolerance.  Has significantly improved since psychiatry to dust her medicines per med rec  # Vertigo: To see most by Dr. Luan Pulling.  Does report that driving seems to exacerbate this.  Difficult to elicit exact symptoms however has been referred to physical therapy at.  # Obesity-patient had lost some weight earlier this year however has regained all of it.  She is eating lots of sweets because they are gluten free alternatives.  ROS:   Denies syncope, palpitations, cough congestion, fevers chills   Pertinent History Reviewed:  Medical & Surgical Hx:  Reviewed: Significant for  Turner syndrome Medications: Reviewed & Updated - see associated section Social History: Reviewed -  reports that she has never smoked. She does not have any smokeless tobacco history on file.  Objective Findings:  Vitals:  Filed Vitals:   02/03/12 1520  BP: 135/82  Pulse: 86  Temp: 97.8 F (36.6 C)    GENERAL:  Adult obese [place] female. In mild discomfort; no respiratory distress, not diaphoretic.   PSYCH: Alert and appropriately interactive; Insight:Good   H&N: AT/[place], trachea midline HEART: RRR, S1/S2 heard, no murmur LUNGS: CTA B, no wheezes, no crackles   Assessment & Plan:

## 2012-02-03 NOTE — Assessment & Plan Note (Signed)
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

## 2012-02-03 NOTE — Patient Instructions (Addendum)
It was nice to see you today.   Today we discussed: 1. Celiac disease/sprue  Work on [employer]  2. Diaphoresis / heat intolerance  I'm glad this is better.  Let us know if it worsens  5. Vertigo  PT should be calling you soon.  Please let us know if you don't hear from them  Please plan to return to see me in 2 months.  If you need anything prior to seeing me please call the clinic.  Please Bring all medications with you to each appointment.

## 2012-02-03 NOTE — Assessment & Plan Note (Signed)
hasnt been able to exercise. Will work on [employer]

## 2012-05-05 ENCOUNTER — Telehealth: Payer: Self-pay | Admitting: Family Medicine

## 2012-05-05 NOTE — Telephone Encounter (Addendum)
[facility] After hours line.   Patient hurt on the job recently at [employer].   Patient basically asking if she needs an appointment with her primary doctor for evaluation of this as she states it is a Doctor, hospital case. I am not sure of our clinic's policy so asked her to call during normal business hours. Will forward to Dr. Berline Chough as well.

## 2012-05-06 ENCOUNTER — Encounter: Payer: Self-pay | Admitting: Sports Medicine

## 2012-05-06 ENCOUNTER — Ambulatory Visit (INDEPENDENT_AMBULATORY_CARE_PROVIDER_SITE_OTHER): Payer: Self-pay | Admitting: Sports Medicine

## 2012-05-06 VITALS — BP 142/95 | HR 84 | Temp 98.7°F | Ht 62.0 in | Wt 213.0 lb

## 2012-05-06 DIAGNOSIS — S335XXA Sprain of ligaments of lumbar spine, initial encounter: Secondary | ICD-10-CM

## 2012-05-06 MED ORDER — CYCLOBENZAPRINE HCL 10 MG PO TABS: 10.0000 mg | ORAL_TABLET | Freq: Three times a day (TID) | ORAL | Status: DC | PRN

## 2012-05-06 NOTE — Patient Instructions (Signed)
It was nice to see you today.   Today we discussed: 1. Lumbar strain Try taking Naproxen Sodium (generic Aleve) 2 pills twice a day with food for the next 7 days.  Then as needed Do the exercises I provided - cyclobenzaprine (FLEXERIL) 10 MG tablet; Take 1 tablet (10 mg total) by mouth 3 (three) times daily as needed for muscle spasms.  Dispense: 60 tablet; Refill: 0    Please plan to return to see me in as previously scheduled/as needed.  If you need anything prior to seeing me please call the clinic.  Please Bring all medications with you to each appointment.

## 2012-05-06 NOTE — Assessment & Plan Note (Signed)
Acute strain at work Flexeril, Aleve 2 pills twice a day Exercises provided

## 2012-05-09 NOTE — Progress Notes (Signed)
  [facility]  Patient name: Holly Richards MRN [id number]  Date of birth: [birth year]  CC & HPI:  Holly Richards is a 32 y.o. female presenting today for evaluation of back pain:  # Acute back pain: Reports fall at work when patient fell and she tried to help them, lumbar pain, non-radiating.  NO radicular symptoms, has been taken out of work.  Taking some pain medicine and muscle relaxers but requesting refills on muscle relaxers.  No falls since.    ROS:  Per HPI  Pertinent History Reviewed:  Medical & Surgical Hx:  Reviewed: Significant for Turner's syndrome, celiac spru Medications: Reviewed & Updated - see associated section Social History: Reviewed -  reports that she has never smoked. She does not have any smokeless tobacco history on file.  Objective Findings:  Vitals: BP 142/95  Pulse 84  Temp(Src) 98.7 F (37.1 C) (Oral)  Ht 5\' 2"  (1.575 m)  Wt 213 lb (96.616 kg)  BMI 38.95 kg/m2   PE: GENERAL:  Adult obese, caucasian  female. In mild discomfort; no respiratory distress. PSYCH: Alert and appropriately interactive; Insight:Good   H&N: AT/[place], trachea midline EENT:  MMM, no scleral icterus, EOMi HEART: RRR, S1/S2 heard, no murmur LUNGS: CTA B, no wheezes, no crackles EXTREMITIES: Moves all 4 extremities spontaneously, warm well perfused, no edema, bilateral DP and PT pulses 2/4.   MSK/OMT Leg Length screening: short left  LUMBAR  L5 rLsL with muscle spasm  SACRUM  RonR  PELVIS  L anterior innominate      Assessment & Plan:

## 2012-09-01 ENCOUNTER — Telehealth: Payer: Self-pay | Admitting: Sports Medicine

## 2012-09-01 NOTE — Telephone Encounter (Signed)
Pt states she is extremely hot. She cannot cool down.  Please advise

## 2012-09-01 NOTE — Telephone Encounter (Signed)
Spoke with patient.  Only way she can describe how she feels is that she is extremely hot, sweating profusely.  Unable to get comfortable for last day or so, nothing is helping her.  She has seen Dr. Berline Chough for this previously.  Told pt to call and schedule appt to be seen tomorrow.  Pt verbalized understanding.  David Rodriquez, Darlyne Russian, CMA

## 2012-09-02 ENCOUNTER — Ambulatory Visit (INDEPENDENT_AMBULATORY_CARE_PROVIDER_SITE_OTHER): Payer: PRIVATE HEALTH INSURANCE | Admitting: Family Medicine

## 2012-09-02 ENCOUNTER — Encounter: Payer: Self-pay | Admitting: Family Medicine

## 2012-09-02 VITALS — BP 138/81 | HR 100 | Temp 99.0°F | Ht 62.0 in | Wt 214.0 lb

## 2012-09-02 DIAGNOSIS — R61 Generalized hyperhidrosis: Secondary | ICD-10-CM

## 2012-09-02 NOTE — Patient Instructions (Signed)
It was nice to meet you today. I am sorry you are still not feeling well. I will call you with the lab results from today. Please make an appointment with Dr. Berline Chough for 1 -2 weeks for follow up on this matter.  Thyroid Diseases Your thyroid is a butterfly-shaped gland in your neck. It is located just above your collarbone. It is one of your endocrine glands, which make hormones. The thyroid helps set your metabolism. Metabolism is how your body gets energy from the foods you eat.  Millions of people have thyroid diseases. Women experience thyroid problems more often than men. In fact, overactive thyroid problems (hyperthyroidism) occur in 1% of all women. If you have a thyroid disease, your body may use energy more slowly or quickly than it should.  Thyroid problems also include an immune disease where your body reacts against your thyroid gland (called thyroiditis). A different problem involves lumps and bumps (called nodules) that develop in the gland. The nodules are usually, but not always, noncancerous. THE MOST COMMON THYROID PROBLEMS AND CAUSES ARE DISCUSSED BELOW There are many causes for thyroid problems. Treatment depends upon the exact diagnosis and includes trying to reset your body's metabolism to a normal rate. Hyperthyroidism Too much thyroid hormone from an overactive thyroid gland is called hyperthyroidism. In hyperthyroidism, the body's metabolism speeds up. One of the most frequent forms of hyperthyroidism is known as Graves' disease. Graves' disease tends to run in families. Although Graves' is thought to be caused by a problem with the immune system, the exact nature of the genetic problem is unknown. Hypothyroidism Too little thyroid hormone from an underactive thyroid gland is called hypothyroidism. In hypothyroidism, the body's metabolism is slowed. Several things can cause this condition. Most causes affect the thyroid gland directly and hurt its ability to make enough hormone.    Rarely, there may be a pituitary gland tumor (located near the base of the brain). The tumor can block the pituitary from producing thyroid-stimulating hormone (TSH). Your body makes TSH to stimulate the thyroid to work properly. If the pituitary does not make enough TSH, the thyroid fails to make enough hormones needed for good health. Whether the problem is caused by thyroid conditions or by the pituitary gland, the result is that the thyroid is not making enough hormones. Hypothyroidism causes many physical and mental processes to become sluggish. The body consumes less oxygen and produces less body heat. Thyroid Nodules A thyroid nodule is a small swelling or lump in the thyroid gland. They are common. These nodules represent either a growth of thyroid tissue or a fluid-filled cyst. Both form a lump in the thyroid gland. Almost half of all people will have tiny thyroid nodules at some point in their lives. Typically, these are not noticeable until they become large and affect normal thyroid size. Larger nodules that are greater than a half inch across (about 1 centimeter) occur in about 5 percent of people. Although most nodules are not cancerous, people who have them should seek medical care to rule out cancer. Also, some thyroid nodules may produce too much thyroid hormone or become too large. Large nodules or a large gland can interfere with breathing or swallowing or may cause neck discomfort. Other problems Other thyroid problems include cancer and thyroiditis. Thyroiditis is a malfunction of the body's immune system. Normally, the immune system works to defend the body against infection and other problems. When the immune system is not working properly, it may mistakenly attack normal  cells, tissues, and organs. Examples of autoimmune diseases are Hashimoto's thyroiditis (which causes low thyroid function) and Graves' disease (which causes excess thyroid function). SYMPTOMS  Symptoms vary greatly  depending upon the exact type of problem with the thyroid. Hyperthyroidism-is when your thyroid is too active and makes more thyroid hormone than your body needs. The most common cause is Graves' Disease. Too much thyroid hormone can cause some or all of the following symptoms:  Anxiety.  Irritability.  Difficulty sleeping.  Fatigue.  A rapid or irregular heartbeat.  A fine tremor of your hands or fingers.  An increase in perspiration.  Sensitivity to heat.  Weight loss, despite normal food intake.  Brittle hair.  Enlargement of your thyroid gland (goiter).  Light menstrual periods.  Frequent bowel movements. Graves' disease can specifically cause eye and skin problems. The skin problems involve reddening and swelling of the skin, often on your shins and on the top of your feet. Eye problems can include the following:  Excess tearing and sensation of grit or sand in either or both eyes.  Reddened or inflamed eyes.  Widening of the space between your eyelids.  Swelling of the lids and tissues around the eyes.  Light sensitivity.  Ulcers on the cornea.  Double vision.  Limited eye movements.  Blurred or reduced vision. Hypothyroidism- is when your thyroid gland is not active enough. This is more common than hyperthyroidism. Symptoms can vary a lot depending of the severity of the hormone deficiency. Symptoms may develop over a long period of time and can include several of the following:  Fatigue.  Sluggishness.  Increased sensitivity to cold.  Constipation.  Pale, dry skin.  A puffy face.  Hoarse voice.  High blood cholesterol level.  Unexplained weight gain.  Muscle aches, tenderness and stiffness.  Pain, stiffness or swelling in your joints.  Muscle weakness.  Heavier than normal menstrual periods.  Brittle fingernails and hair.  Depression. Thyroid Nodules - most do not cause signs or symptoms. Occasionally, some may become so large that  you can feel or even see the swelling at the base of your neck. You may realize a lump or swelling is there when you are shaving or putting on makeup. Men might become aware of a nodule when shirt collars suddenly feel too tight. Some nodules produce too much thyroid hormone. This can produce the same symptoms as hyperthyroidism (see above). Thyroid nodules are seldom cancerous. However, a nodule is more likely to be malignant (cancerous) if it:  Grows quickly or feels hard.  Causes you to become hoarse or to have trouble swallowing or breathing.  Causes enlarged lymph nodes under your jaw or in your neck. DIAGNOSIS  Because there are so many possible thyroid conditions, your caregiver may ask for a number of tests. They will do this in order to narrow down the exact diagnosis. These tests can include:  Blood and antibody tests.  Special thyroid scans using small, safe amounts of radioactive iodine.  Ultrasound of the thyroid gland (particularly if there is a nodule or lump).  Biopsy. This is usually done with a special needle. A needle biopsy is a procedure to obtain a sample of cells from the thyroid. The tissue will be tested in a lab and examined under a microscope. TREATMENT  Treatment depends on the exact diagnosis. Hyperthyroidism  Beta-blockers help relieve many of the symptoms.  Anti-thyroid medications prevent the thyroid from making excess hormones.  Radioactive iodine treatment can destroy overactive thyroid cells.  The iodine can permanently decrease the amount of hormone produced.  Surgery to remove the thyroid gland.  Treatments for eye problems that come from Graves' disease also include medications and special eye surgery, if felt to be appropriate. Hypothyroidism Thyroid replacement with levothyroxine is the mainstay of treatment. Treatment with thyroid replacement is usually lifelong and will require monitoring and adjustment from time to time. Thyroid  Nodules  Watchful waiting. If a small nodule causes no symptoms or signs of cancer on biopsy, then no treatment may be chosen at first. Re-exam and re-checking blood tests would be the recommended follow-up.  Anti-thyroid medications or radioactive iodine treatment may be recommended if the nodules produce too much thyroid hormone (see Treatment for Hyperthyroidism above).  Alcohol ablation. Injections of small amounts of ethyl alcohol (ethanol) can cause a non-cancerous nodule to shrink in size.  Surgery (see Treatment for Hyperthyroidism above). HOME CARE INSTRUCTIONS   Take medications as instructed.  Follow through on recommended testing. SEEK MEDICAL CARE IF:   You feel that you are developing symptoms of Hyperthyroidism or Hypothyroidism as described above.  You develop a new lump/nodule in the neck/thyroid area that you had not noticed before.  You feel that you are having side effects from medicines prescribed.  You develop trouble breathing or swallowing. SEEK IMMEDIATE MEDICAL CARE IF:   You develop a fever of 102 F (38.9 C) or higher.  You develop severe sweating.  You develop palpitations and/or rapid heart beat.  You develop shortness of breath.  You develop nausea and vomiting.  You develop extreme shakiness.  You develop agitation.  You develop lightheadedness or have a fainting episode. Document Released: 12/29/2006 Document Revised: 05/26/2011 Document Reviewed: 12/29/2006 [facility] Patient Information 2014 [place], [place].

## 2012-09-02 NOTE — Progress Notes (Signed)
Subjective:     Patient ID: Holly Richards, female   DOB: 1981-02-22, 32 y.o.   MRN: [id number]  HPI Hot flashes: Patient seen in clinic today for same day appointment for ongoing hot flashes. She reports she was seen in clinic prior for same reason without a definite answer and they have been getting worse over the last 3 days. She repors her temperature during these events are 98-99 degrees F, but her body temp typically runs lower and she feels this is a low grade temperature. The events occur day and night, causing the patient to need minimal clothing and fans, because she is so hot and sweats through her clothes. Of note she does have AC in her home and her husband is comfortable when she is having hot flashes. She also reports mild headache that has started at about the same time as her hot flashes. Her LMP was in 2008, due to early ovarian failure (per pt), from her Turners syndrome. She had been fully worked up at this time by a physician at [facility] in [place] (Dr. Arelia Sneddon). She has also aburtply discontinued her risperidone, zoloft and trazodone 3 days ago because she thought maybe one of those medications might have been causing her symptoms as a side effect, last doses 6/17. She reports she does not get good sleep because she is too hot. Possible associated symptoms are constant "loose stools" with only formed stool on rare occasions (chronic). History of panic attacks, but she states this is not at all how her panic attacks normally feel. She has also had a notable weight gain of ~10 pounds since last October. She denies nausea, vomit, diarrhea, polydipsia, chest pain or heart palpitations (other than her panic attacks).   Review of Systems See above HPI    Objective:   Physical Exam BP 138/81  Pulse 100  Temp(Src) 99 F (37.2 C) (Oral)  Ht 5\' 2"  (1.575 m)  Wt 214 lb (97.07 kg)  BMI 39.13 kg/m2 Gen: Obese female. NAD, no current sweat or hot flash noted.  CV:RRR. No  murmur appreciated. Lungs: CTAB. No wheezing or rales noted.  Ext: no erythema or edema. +2/4 pulses bilaterally.

## 2012-09-03 LAB — COMPREHENSIVE METABOLIC PANEL
ALT: 99 U/L — ABNORMAL HIGH (ref 0–35)
AST: 62 U/L — ABNORMAL HIGH (ref 0–37)
Alkaline Phosphatase: 128 U/L — ABNORMAL HIGH (ref 39–117)
Creat: 0.58 mg/dL (ref 0.50–1.10)
Total Bilirubin: 0.2 mg/dL — ABNORMAL LOW (ref 0.3–1.2)

## 2012-09-03 LAB — T4, FREE: Free T4: 0.95 ng/dL (ref 0.80–1.80)

## 2012-09-03 LAB — T3, FREE: T3, Free: 2.7 pg/mL (ref 2.3–4.2)

## 2012-09-03 NOTE — Assessment & Plan Note (Addendum)
-   Obtained repeat labs today to monitor TSH. It appears her TSH is tracking upwards with normal T3/T4.  - Uncertain if this is related to her Turner's syndrome - Would consider a referral to endocrine with her history of Turners, being administered GH, early menopause and now current symptoms with TSH irregularity.  - may benefit from estrogen therapy with diagnosis of Turners, early ovarian failure and elevated LFT - Possible vasomotor component. May consider adding low dose Effexor to treatment, or in place of Zoloft. - encouraged her to restart medications she had discontinued (risperiodone, trazodone and Zoloft)  - F/U: 1 -2 week with PCP for lab review and clinic appointment able to fully address this issue.

## 2012-09-06 ENCOUNTER — Telehealth: Payer: Self-pay | Admitting: Sports Medicine

## 2012-09-06 NOTE — Telephone Encounter (Signed)
Will fwd to MD for lab results, please read note from patient taken below.  Chinaza Rooke, Darlyne Russian, CMA

## 2012-09-06 NOTE — Telephone Encounter (Signed)
Spoke with patient and explained what Dr. Claiborne Billings said and pt verbalized understanding.  Her f/u with PCP is 09/14/2012.  Toni Hoffmeister, Darlyne Russian, CMA

## 2012-09-06 NOTE — Telephone Encounter (Signed)
Will discuss with her at follow-up.

## 2012-09-06 NOTE — Telephone Encounter (Signed)
It was explained to her prior, her appointment was for a same day. She had a chronic issue, labs were retrieved and forwarded to her PCP. There is no quick cure and tests have to be performed in order for proper treatment. She is to follow up with her PCP. He is aware of her labs and condition and is looking into the possibilities. She will need to see him to discuss referrals, medications and plan. Her PCP is Berline Chough.

## 2012-09-06 NOTE — Telephone Encounter (Signed)
Patient is calling for her lab results.  She really doesn't want to have to wait for another appointment for those results because she doesn't want to have to pay another copay.  She also feels that she came for an appointment with a problem and she saw a doctor, paid her copay, had bloodwork and got no resolution, not treatment and still feels bad.  It is frustrating to her to not have results, answers and have to come back to see a different doctor and have to expain the problem again just to get her results and hopefully then get treatment.

## 2012-09-09 ENCOUNTER — Encounter: Payer: Self-pay | Admitting: Sports Medicine

## 2012-09-14 ENCOUNTER — Encounter: Payer: Self-pay | Admitting: Sports Medicine

## 2012-09-14 ENCOUNTER — Ambulatory Visit (INDEPENDENT_AMBULATORY_CARE_PROVIDER_SITE_OTHER): Payer: PRIVATE HEALTH INSURANCE | Admitting: Sports Medicine

## 2012-09-14 VITALS — BP 135/86 | HR 91 | Temp 98.2°F | Wt 214.0 lb

## 2012-09-14 DIAGNOSIS — E039 Hypothyroidism, unspecified: Secondary | ICD-10-CM

## 2012-09-14 DIAGNOSIS — F319 Bipolar disorder, unspecified: Secondary | ICD-10-CM

## 2012-09-14 DIAGNOSIS — Q969 Turner's syndrome, unspecified: Secondary | ICD-10-CM

## 2012-09-14 MED ORDER — ESTROGENS CONJUGATED 0.3 MG PO TABS: 0.3000 mg | ORAL_TABLET | Freq: Every day | ORAL | Status: DC

## 2012-09-14 NOTE — Assessment & Plan Note (Addendum)
Will start Estrogen therapy for presumed vaso-motor symptoms - discussed risks and benefits including increased heart and vascular including VTE - pt is non-smoker Baseline Labs today Will Recheck CMET and Lipid Profile today - I have requested records from Dr. Arelia Sneddon ([facility]) > f/u Vasomotor sypmtoms, LFTs > consider referal to endocrinology if not significantly improved or if laboratory abnormalities worsen

## 2012-09-14 NOTE — Progress Notes (Signed)
  [facility]  Patient name: Holly Richards MRN [id number]  Date of birth: March 28, 1980  CC & HPI:  Holly Richards is a 32 y.o. female presenting today for follow up of:  # Hot Flashes:reports that she has frequent profound periods where she becomes excessively hot.  Using fans and minimal clothing while at home to keep cool.  Keep her house at 65.  # Anxious:  Been much more anxious having a difficult timecoping at work.  Associates this with the profound sweating and hot flashes or  # Bipolar Disorder: has been followed by psychiatry.  They have been prescribing all for medicines.  She has not been taking her Risperdal.  She reports that she has been excessively sleepy especially after taking her Risperdal before going to work  ------------------------------------------------------------------------------------------------------------------ Medication Compliance: compliant all of the time  Diet Compliance: compliant most of the time   ROS:  PER HPI  Pertinent History Reviewed:  Medical & Surgical Hx:  Reviewed: Significant for Turner's Syndrome - had received GH injections at younger age Medications: Reviewed & Updated - See associated section in EMR Social History: Reviewed -  reports that she has never smoked. She does not have any smokeless tobacco history on file.   Objective Findings:  Vitals: BP 135/86  Pulse 91  Temp(Src) 98.2 F (36.8 C) (Oral)  Wt 214 lb (97.07 kg)  BMI 39.13 kg/m2  PE: GENERAL:  Adult Caucasian   female. In no discomfort; no respiratory distress. PSYCH: Alert and appropriately interactive;anxious appearing and pressured speech. Insight: good toFair   H&N: AT/[place], trachea midline, wade based EENT:  MMM, no scleral icterus, EOMi HEART: RRR, S1/S2 heard, no murmur LUNGS: CTA B, no wheezes, no crackles ABDOMEN: obese, +BS, soft, non-tender, no rigidity, no guarding, no masses/organomegaly EXTREMITIES: Moves all 4 extremities spontaneously, warm  well perfused, no edema, bilateral DP and PT pulses 2/4.      Assessment & Plan:

## 2012-09-14 NOTE — Patient Instructions (Addendum)
It was good to see you.  I am starting you on Estrogen therapy.  We have collected baseline labs.     Please follow up with your psychiatrist.  I recommend you consider changing to Latuda instead of Risperadol and Tazodone.  This tends to be a better tolerated medication  I would like to see you back in 4-6 weeks.  Please come back sooner if needed.  Hormone Therapy At menopause, your body begins making less estrogen and progesterone hormones. This causes the body to stop having menstrual periods. This is because estrogen and progesterone hormones control your periods and menstrual cycle. A lack of estrogen may cause symptoms such as:  Hot flushes (or hot flashes).  Vaginal dryness.  Dry skin.  Loss of sex drive.  Risk of bone loss (osteoporosis). When this happens, you may choose to take hormone therapy to get back the estrogen lost during menopause. When the hormone estrogen is given alone, it is usually referred to as ET (Estrogen Therapy). When the hormone progestin is combined with estrogen, it is generally called HT (Hormone Therapy). This was formerly known as hormone replacement therapy (HRT). Your caregiver can help you make a decision on what will be best for you. The decision to use HT seems to change often as new studies are done. Many studies do not agree on the benefits of hormone replacement therapy. LIKELY BENEFITS OF HT INCLUDE PROTECTION FROM:  Hot Flushes (also called hot flashes) - A hot flush is a sudden feeling of heat that spreads over the face and body. The skin may redden like a blush. It is connected with sweats and sleep disturbance. Women going through menopause may have hot flushes a few times a month or several times per day depending on the woman.  Osteoporosis (bone loss)- Estrogen helps guard against bone loss. After menopause, a woman's bones slowly lose calcium and become weak and brittle. As a result, bones are more likely to break. The hip, wrist, and  spine are affected most often. Hormone therapy can help slow bone loss after menopause. Weight bearing exercise and taking calcium with vitamin D also can help prevent bone loss. There are also medications that your caregiver can prescribe that can help prevent osteoporosis.  Vaginal Dryness - Loss of estrogen causes changes in the vagina. Its lining may become thin and dry. These changes can cause pain and bleeding during sexual intercourse. Dryness can also lead to infections. This can cause burning and itching. (Vaginal estrogen treatment can help relieve pain, itching, and dryness.)  Urinary Tract Infections are more common after menopause because of lack of estrogen. Some women also develop urinary incontinence because of low estrogen levels in the vagina and bladder.  Possible other benefits of estrogen include a positive effect on mood and short-term memory in women. RISKS AND COMPLICATIONS  Using estrogen alone without progesterone causes the lining of the uterus to grow. This increases the risk of lining of the uterus (endometrial) cancer. Your caregiver should give another hormone called progestin if you have a uterus.  Women who take combined (estrogen and progestin) HT appear to have an increased risk of breast cancer. The risk appears to be small, but increases throughout the time that HT is taken.  Combined therapy also makes the breast tissue slightly denser which makes it harder to read mammograms (breast X-rays).  Combined, estrogen and progesterone therapy can be taken together every day, in which case there may be spotting of blood. HT therapy can  be taken cyclically in which case you will have menstrual periods. Cyclically means HT is taken for a set amount of days, then not taken, then this process is repeated.  HT may increase the risk of stroke, heart attack, breast cancer and forming blood clots in your leg.  Transdermal estrogen (estrogen that is absorbed through the skin  with a patch or a cream) may have more positive results with:  Cholesterol.  Blood pressure.  Blood clots. Having the following conditions may indicate you should not have HT:  Endometrial cancer.  Liver disease.  Breast cancer.  Heart disease.  History of blood clots.  Stroke. TREATMENT   If you choose to take HT and have a uterus, usually estrogen and progestin are prescribed.  Your caregiver will help you decide the best way to take the medications.  Possible ways to take estrogen include:  Pills.  Patches.  Gels.  Sprays.  Vaginal estrogen cream, rings and tablets.  It is best to take the lowest dose possible that will help your symptoms and take them for the shortest period of time that you can.  Hormone therapy can help relieve some of the problems (symptoms) that affect women at menopause. Before making a decision about HT, talk to your caregiver about what is best for you. Be well informed and comfortable with your decisions. HOME CARE INSTRUCTIONS   Follow your caregivers advice when taking the medications.  A Pap test is done to screen for cervical cancer.  The first Pap test should be done at age 47.  Between ages 35 and 34, Pap tests are repeated every 2 years.  Beginning at age 32, you are advised to have a Pap test every 3 years as long as your past 3 Pap tests have been normal.  Some women have medical problems that increase the chance of getting cervical cancer. Talk to your caregiver about these problems. It is especially important to talk to your caregiver if a new problem develops soon after your last Pap test. In these cases, your caregiver may recommend more frequent screening and Pap tests.  The above recommendations are the same for women who have or have not gotten the vaccine for HPV (Human Papillomavirus).  If you had a hysterectomy for a problem that was not a cancer or a condition that could lead to cancer, then you no longer need  Pap tests. However, even if you no longer need a Pap test, a regular exam is a good idea to make sure no other problems are starting.   If you are between ages 51 and 37, and you have had normal Pap tests going back 10 years, you no longer need Pap tests. However, even if you no longer need a Pap test, a regular exam is a good idea to make sure no other problems are starting.   If you have had past treatment for cervical cancer or a condition that could lead to cancer, you need Pap tests and screening for cancer for at least 20 years after your treatment.  If Pap tests have been discontinued, risk factors (such as a new sexual partner) need to be re-assessed to determine if screening should be resumed.  Some women may need screenings more often if they are at high risk for cervical cancer.  Get mammograms done as per the advice of your caregiver. SEEK IMMEDIATE MEDICAL CARE IF:  You develop abnormal vaginal bleeding.  You have pain or swelling in your legs, shortness of  breath, or chest pain.  You develop dizziness or headaches.  You have lumps or changes in your breasts or armpits.  You have slurred speech.  You develop weakness or numbness of your arms or legs.  You have pain, burning, or bleeding when urinating.  You develop abdominal pain. Document Released: 11/30/2002 Document Revised: 05/26/2011 Document Reviewed: 03/20/2010 [facility] Patient Information 2014 [place], [place].

## 2012-09-15 LAB — LIPID PANEL
Cholesterol: 293 mg/dL — ABNORMAL HIGH (ref 0–200)
HDL: 36 mg/dL — ABNORMAL LOW (ref >39)
Total CHOL/HDL Ratio: 8.1 Ratio
Triglycerides: 520 mg/dL — ABNORMAL HIGH (ref <150)

## 2012-09-15 LAB — THYROID ANTIBODIES: Thyroglobulin Ab: 49.6 U/mL — ABNORMAL HIGH (ref <40.0)

## 2012-09-15 NOTE — Assessment & Plan Note (Signed)
Encouraged to restart her bipolar medications; take her Risperdal before she sleeps as opposed to before she works. - recommened to discuss change in therapy to Latuda with Dr. 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 > consider encouraging her to investigate alternative hours

## 2012-09-15 NOTE — Assessment & Plan Note (Signed)
Recently checked Will check Thyroid anti-bodies as well today

## 2012-09-29 ENCOUNTER — Telehealth: Payer: Self-pay | Admitting: Sports Medicine

## 2012-09-29 ENCOUNTER — Encounter: Payer: Self-pay | Admitting: Sports Medicine

## 2012-09-29 DIAGNOSIS — Q969 Turner's syndrome, unspecified: Secondary | ICD-10-CM

## 2012-09-29 DIAGNOSIS — E039 Hypothyroidism, unspecified: Secondary | ICD-10-CM

## 2012-09-29 NOTE — Assessment & Plan Note (Signed)
Elevated TSH and abnormal Thyroid Anti-bodies Refer to Endocrinology

## 2012-09-29 NOTE — Telephone Encounter (Signed)
Called and left message with Tresa Endo regarding her abnormal thyroid antibodies.  She does need to be seen by endocrinology for this and I will go ahead and place this referral.  I have talked with Doctor Armen Pickup is about this.  We'll place under Dr. Jennette Kettle  I will be around clinic tomorrow and if patient calls back I am more than happy to talk with her.  Otherwise I will talk with her when I return next week if she has any further questions.

## 2012-10-15 ENCOUNTER — Ambulatory Visit (INDEPENDENT_AMBULATORY_CARE_PROVIDER_SITE_OTHER): Payer: PRIVATE HEALTH INSURANCE | Admitting: Endocrinology

## 2012-10-15 ENCOUNTER — Other Ambulatory Visit: Payer: Self-pay | Admitting: *Deleted

## 2012-10-15 ENCOUNTER — Encounter: Payer: Self-pay | Admitting: Endocrinology

## 2012-10-15 VITALS — BP 122/80 | HR 79 | Temp 98.1°F | Resp 12 | Ht 63.0 in | Wt 217.8 lb

## 2012-10-15 DIAGNOSIS — R7303 Prediabetes: Secondary | ICD-10-CM | POA: Insufficient documentation

## 2012-10-15 DIAGNOSIS — E785 Hyperlipidemia, unspecified: Secondary | ICD-10-CM

## 2012-10-15 DIAGNOSIS — R7309 Other abnormal glucose: Secondary | ICD-10-CM

## 2012-10-15 DIAGNOSIS — E039 Hypothyroidism, unspecified: Secondary | ICD-10-CM

## 2012-10-15 MED ORDER — LEVOTHYROXINE SODIUM 50 MCG PO TABS: 50.0000 ug | ORAL_TABLET | Freq: Every day | ORAL | Status: DC

## 2012-10-15 NOTE — Progress Notes (Signed)
Patient ID: Holly Richards, female   DOB: 1980/09/28, 32 y.o.   MRN: [id number]  Reason for Appointment:  Hypothyroidism, new visit   History of Present Illness:   Problem 1: The Hyothyroidism was first diagnosed in 2009    The symptoms consistent with hypothyroidism are: fatigue 5 yrs, feeling sleepy, no difficulty concentrating , no dry skin , difficulty with weight loss ,no hair loss no facial hair.           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 She says she is quite symptomatic with this and gets hot easily with sweating. She will also have difficulty sleeping at night because of hot flashes and sweating spells Because of her symptoms she was recently started on 0.3 mg of Premarin and her symptoms are slightly better  Lab Results  Component Value Date   TSH 13.320* 09/02/2012   TSH 9.493* 01/13/2012   TSH 5.399* 12/30/2010   TSH 12.727* 11/06/2010     No visits with results within 1 Week(s) from this visit. Latest known visit with results is:  Office Visit on 09/14/2012  Component Date Value Range Status  . [facility] 09/14/2012 14.8   Final   Comment: Reference Ranges:                                   Female:                         1.4 -  18.1 mIU/mL                                   Female:   Follicular Phase    2.5 -  10.2 mIU/mL                                             MidCycle Peak       3.4 -  33.4 mIU/mL                                             Luteal Phase        1.5 -   9.1 mIU/mL                                             Post Menopausal    23.0 - 116.3 mIU/mL  Pregnant                <   0.3 mIU/mL  . LH 09/14/2012 7.3   Final   Comment: Reference Ranges:                                   Female:     20 - 70 Years           1.5 -  9.3 mIU/mL                                                > 70 Years           3.1 - 34.6 mIU/mL                                   Female:   Follicular Phase        1.9 - 12.5 mIU/mL                                             Midcycle                8.7 - 76.3 mIU/mL                                             Luteal Phase            0.5 - 16.9 mIU/mL                                             Post Menopausal        15.9 - 54.0 mIU/mL                                             Pregnant                    <  1.5 mIU/mL                                             Contraceptives          0.7 -  5.6 mIU/mL                                   Children:                             <  6.0 mIU/mL                             .  Thyroid Peroxidase Antibody 09/14/2012 484.0* <35.0 IU/mL Final  . Thyroglobulin Ab 09/14/2012 49.6* <40.0 U/mL Final   Comment:                            The thyroid microsomal antigen has been shown to be Thyroid                          Peroxidase (TPO).  This assay detects anti-TPO antibodies.  . Cholesterol 09/14/2012 293* 0 - 200 mg/dL Final   Comment: ATP III Classification:                                < 200        mg/dL        Desirable                               200 - 239     mg/dL        Borderline High                               >= 240        mg/dL        High                             . Triglycerides 09/14/2012 520* <150 mg/dL Final  . HDL 40/98/1191 36* >39 mg/dL Final  . Total CHOL/HDL Ratio 09/14/2012 8.1   Final  . VLDL 09/14/2012 NOT CALC  0 - 40 mg/dL Final   Comment:                            Not calculated due to Triglyceride >400.                          Suggest ordering Direct LDL (Unit Code: 47829).  . LDL Cholesterol 09/14/2012    Final   Comment:                             Not calculated due to Triglyceride >400.                          Suggest ordering Direct LDL (Unit Code: 56213).                                                     Total Cholesterol/HDL Ratio:CHD Risk                                                 Coronary Heart Disease Risk Table  Men       Women                                   1/2 Average Risk              3.4        3.3                                       Average Risk              5.0        4.4                                    2X Average Risk              9.6        7.1                                    3X Average Risk             23.4       11.0                          Use the calculated Patient Ratio above and the CHD Risk table                           to determine the patient's CHD Risk.                          ATP III Classification (LDL):                                < 100        mg/dL         Optimal                               100 - 129     mg/dL         Near or Above Optimal                               130 - 159     mg/dL         Borderline High                               160 - 189     mg/dL         High                                > 190        mg/dL         Very High  Past Medical History  Diagnosis Date  . Anxiety   . Bipolar disorder   . Migraines     Past Surgical History  Procedure Laterality Date  . Cholecystectomy      Family History  Problem Relation Age of Onset  . Autoimmune disease Mother   . Alcohol abuse Father   . Hyperlipidemia Father   . Hypertension Father   . Amenorrhea Brother   . Diabetes Maternal Grandmother   . Diabetes Maternal Grandfather    RA mom No thyroid  Social History:  reports that she has never smoked. She does not have any smokeless tobacco history on file. Her alcohol and drug histories are not on file.  Allergies:  Allergies   Allergen Reactions  . Tamiflu       Medication List       This list is accurate as of: 10/15/12  9:35 AM.  Always use your most recent med list.               albuterol 108 (90 BASE) MCG/ACT inhaler  Commonly known as:  PROVENTIL HFA;VENTOLIN HFA  Inhale two to 4 puffs every 6 hours as needed for shortness or breath, cough, or wheezing.     estrogens (conjugated) 0.3 MG tablet  Commonly known as:  PREMARIN  Take 1 tablet (0.3 mg total) by mouth daily.     HYDROcodone-acetaminophen 5-500 MG per tablet  Commonly known as:  VICODIN     ondansetron 4 MG disintegrating tablet  Commonly known as:  ZOFRAN-ODT  Take 1-2 tablets (4-8 mg total) by mouth every 8 (eight) hours as needed for nausea.     risperiDONE 1 MG tablet  Commonly known as:  RISPERDAL  Take 1 tablet (1 mg total) by mouth daily.     sertraline 100 MG tablet  Commonly known as:  ZOLOFT  Take 100 mg by mouth daily.     traZODone 150 MG tablet  Commonly known as:  DESYREL  Take 0.5 tablets (75 mg total) by mouth at bedtime.        Review of Systems:  She has a mild history of asthma CARDIOLOGY: no history of high blood pressure.            GASTROENTEROLOGY: She has known Celiac disease and tends to have loose stools     ENDOCRINOLOGY:  no history of Diabetes.     She has a positive history of depression         Examination:    BP 122/80  Pulse 79  Temp(Src) 98.1 F (36.7 C)  Resp 12  Ht 5\' 3"  (1.6 m)  Wt 217 lb 12.8 oz (98.793 kg)  BMI 38.59 kg/m2  SpO2 98%   General Appearance: pleasant, generalized obesity present         Eyes: Eyes externally normal, left fundus difficult to focus, right fundus grossly normal  ENT: Did not have a high arched palate, oral mucosa normal        Neck:  she has a short and wide neck. Also has a low hairline in the back  The thyroid is nonpalpable . There is no lymphadenopathy in the neck.    Cardiovascular: Normal apex and heart sounds, no murmur Respiratory:   Lungs clear Gastrointestinal: abdomen soft, no hepatosplenomegaly, no purple striae Neurological: REFLEXES: at biceps are difficult to elicit as also at the ankles.     Skin: moist, warm, no rashes      Musculoskeletal: She has a short  right fourth finger and absent knuckles on these fingers   Assessments   1. 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 PREDIABETES: She has had mild increase in glucose levels but did not have any fasting glucose or A1c levels  4. History of hyperlipidemia and fatty liver related to metabolic syndrome. Recent triglycerides are over 500. She does need weight loss with diet and exercise and also  lipid-lowering drugs for hypertriglyceridemia. Will also need measurement of direct LDL which is not available Will defer treatment of PCP  Treatment:    She will be started on Synthroid 50 mcg daily and her free T4 and TSH will be checked in 6 weeks Discussed autoimmune nature of her hypothyroidism, need for lifelong supplementation and clinical presentation of the condition Given patient information brochures on Hashimoto thyroiditis and hypothyroidism   Since she has already been started on HRT by PCP  will defer further treatment by him. However she will need to take this long-term till about age of 77 and will do better with taking Prempro or similar product which will include progesterone rather than estrogen alone, also will need 0.625 mg of Premarin as an effective dose Also because of her history of hypertriglyceridemia she should do better with using a transdermal preparation rather than oral Discussed prevention of osteopenia with taking long-term hormonal supplements  She will need to have fasting glucose and A1c checked from her next visit  Tabor Denham 10/15/2012, 9:35 AM

## 2012-10-15 NOTE — Patient Instructions (Addendum)
See info given   Synthroid 50ug in am daily before Bfst

## 2012-10-18 DIAGNOSIS — E785 Hyperlipidemia, unspecified: Secondary | ICD-10-CM | POA: Insufficient documentation

## 2012-10-21 ENCOUNTER — Telehealth: Payer: Self-pay | Admitting: Family Medicine

## 2012-10-21 NOTE — Telephone Encounter (Addendum)
Call to [facility] Emergency Line  Pt calls emergency line reporting nausea since last night and 2 episodes of emesis, most recently at 9AM. One week ago she started taking synthroid and hormone replacement therapy and wonders if this could be causing symptoms. She is able to keep down liquids and already has an appt with Dr. Berline Chough her PCP in the morning. She just wanted to be sure she is okay to stay home until then. She also reports dysuria, decreased urination to 1/2 of normal, subjective hot flashes (why she started HRT), diarrhea x multiple episodes today, mild abdominal cramping, some dyspnea, rhinorrhea, and dizziness (though a chronic issue with hot flashes). She denies syncope, severe abdominal pain, chest pain, cough, sneezing, or bloody emesis /stool.  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 Denies medications other than aforementioned. Denies change in foods or activity.  PMH is significant for Turner's syndrome, hypothyroidism, celiac disease, and anxiety.  A/P: 32 y.o. with nausea, diarrhea, subjective fevers, and rhinorrhea with sick contacts and recent initiation of synthroid and hormone replacement. No acute reason for going to ED or getting admitted. Likely viral gastroenteritis vs related to thyroid hormone.  - Push fluids (gatorade, soup) overnight - Rest - Return precautions reviewed (worse vomiting, bloody emesis or diarrhea, severe abdominal pain, or other concerns) - Return for scheduled visit tomorrow, consider rechecking TSH and hormones   Holly Singleton, MD 10/21/2012 7:05 PM

## 2012-10-22 ENCOUNTER — Encounter: Payer: Self-pay | Admitting: Sports Medicine

## 2012-10-22 ENCOUNTER — Telehealth: Payer: Self-pay | Admitting: Sports Medicine

## 2012-10-22 ENCOUNTER — Ambulatory Visit (INDEPENDENT_AMBULATORY_CARE_PROVIDER_SITE_OTHER): Payer: PRIVATE HEALTH INSURANCE | Admitting: Sports Medicine

## 2012-10-22 ENCOUNTER — Telehealth: Payer: Self-pay | Admitting: Family Medicine

## 2012-10-22 ENCOUNTER — Other Ambulatory Visit (HOSPITAL_COMMUNITY)
Admission: RE | Admit: 2012-10-22 | Discharge: 2012-10-22 | Disposition: A | Discharge status: Home / Self Care | Payer: PRIVATE HEALTH INSURANCE | Source: Ambulatory Visit | Attending: Family Medicine | Admitting: Family Medicine

## 2012-10-22 VITALS — BP 127/87 | HR 83 | Temp 97.6°F | Ht 63.0 in | Wt 214.7 lb

## 2012-10-22 DIAGNOSIS — E039 Hypothyroidism, unspecified: Secondary | ICD-10-CM

## 2012-10-22 DIAGNOSIS — Z113 Encounter for screening for infections with a predominantly sexual mode of transmission: Secondary | ICD-10-CM | POA: Insufficient documentation

## 2012-10-22 DIAGNOSIS — R1031 Right lower quadrant pain: Secondary | ICD-10-CM

## 2012-10-22 DIAGNOSIS — Q969 Turner's syndrome, unspecified: Secondary | ICD-10-CM

## 2012-10-22 DIAGNOSIS — K900 Celiac disease: Secondary | ICD-10-CM

## 2012-10-22 LAB — COMPREHENSIVE METABOLIC PANEL
ALT: 73 U/L — ABNORMAL HIGH (ref 0–35)
AST: 44 U/L — ABNORMAL HIGH (ref 0–37)
Alkaline Phosphatase: 131 U/L — ABNORMAL HIGH (ref 39–117)
Glucose, Bld: 104 mg/dL — ABNORMAL HIGH (ref 70–99)
Potassium: 4 mEq/L (ref 3.5–5.3)
Sodium: 137 mEq/L (ref 135–145)
Total Bilirubin: 0.2 mg/dL — ABNORMAL LOW (ref 0.3–1.2)
Total Protein: 7 g/dL (ref 6.0–8.3)

## 2012-10-22 LAB — CBC WITH DIFFERENTIAL/PLATELET
Basophils Absolute: 0 10*3/uL (ref 0.0–0.1)
Basophils Relative: 0 % (ref 0–1)
Eosinophils Absolute: 0.6 10*3/uL (ref 0.0–0.7)
MCH: 29.5 pg (ref 26.0–34.0)
MCHC: 33.5 g/dL (ref 30.0–36.0)
Neutro Abs: 3.1 10*3/uL (ref 1.7–7.7)
Neutrophils Relative %: 50 % (ref 43–77)
Platelets: 220 10*3/uL (ref 150–400)
RDW: 12.5 % (ref 11.5–15.5)

## 2012-10-22 LAB — POCT URINE PREGNANCY: Preg Test, Ur: NEGATIVE

## 2012-10-22 MED ORDER — ONDANSETRON 4 MG PO TBDP: 4.0000 mg | ORAL_TABLET | Freq: Three times a day (TID) | ORAL | Status: DC | PRN

## 2012-10-22 NOTE — Assessment & Plan Note (Signed)
Checking labs today. Potential concern for appendicitis.  Patient is tender on exam of the right lower quadrant and has rebound and guarding.  She has no cervical motion tenderness.  Have reviewed options with her and she is adamant on going home with Zofran and will return to care [facility] [employer] where she is employed if she has any significant worsening.  If the lab work reveals any significant abnormality I will recommend that she be seen at [facility] today. Likely viral gastroenteritis however given history differential includes flare of her celiac sprue or appendicitis.  She is status post cholecystectomy.

## 2012-10-22 NOTE — Telephone Encounter (Signed)
Patient is calling to find out the results of her blood work.

## 2012-10-22 NOTE — Telephone Encounter (Signed)
Please review (several abnormals)  and I will be happy to return call to pt with message. Thanks! Wyatt Haste, RN-BSN

## 2012-10-22 NOTE — Progress Notes (Signed)
  [facility]  Patient name: Holly Richards MRN [id number]  Date of birth: 07/01/1980  CC & HPI:  Holly Richards is a 32 y.o. female presenting to clinic.  Chief Complaint  Patient presents with  . 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 She does not have any smokeless tobacco history on file.  Objective Findings:  Vitals: BP 127/87  Pulse 83  Temp(Src) 97.6 F (36.4 C) (Oral)  Ht 5\' 3"  (1.6 m)  Wt 214 lb 11.2 oz (97.387 kg)  BMI 38.04 kg/m2 PE: GENERAL:  adult Caucasian obese  female. In moderate discomfort; no respiratory distress  PSYCH:  alert and appropriate, good insight   HNEENT:   moist mucous membranes   CARDIO:  RRR, S1/S2 heard, no murmur  LUNGS:  CTA B, no wheezes, no crackles  ABDOMEN:  Hypoactive BS, soft, moderately tender with rebound localizing to the right lower quadrant, pain not specifically located over McBurney's point but generally over right lower quadrant, generalized guarding.  Negative so as on No rigidity, pain with heel tap ;   EXTREM:  Warm well perfused   GU:  Hemoccult negative  bimanual exam Diffusely tender but no cervical motion  tenderness appreciated.     SKIN:  no rash   NEUROMSK:     Assessment & Plan:  See problem associated charting

## 2012-10-22 NOTE — Assessment & Plan Note (Signed)
Consider flare as patient had atypical pain presentations at the onset of her diagnosis.  Continue gluten free diet.

## 2012-10-22 NOTE — Assessment & Plan Note (Addendum)
Followup in 6 weeks. >Discuss bone mineral density screening at next visit.  > Also discussed changing to combination estrogen/progestin therapy at next visit.

## 2012-10-22 NOTE — Patient Instructions (Signed)
It was nice to see you today, thanks for coming in!  Problem List Items Addressed This Visit   Turner syndrome - Primary (Chronic)     Followup in 6 weeks. >Discuss bone mineral density screening at next visit.  > Also discuss changing to combination estrogen/progestin therapy at next visit.    Thyroid activity decreased     continue your Synthroid    Celiac disease/sprue     Continue gluten free diet.    Abdominal pain, right lower quadrant     if you have worsening abdominal pain and recommend she be seen at [facility]. You can try Zofran to help with her nausea.  I sent in a prescription.    Relevant Orders      CBC w/Diff      Comprehensive metabolic panel      Lipase      POCT urinalysis dipstick      POCT urine pregnancy      Urine cytology ancillary only       Please plan to return to see 6weeks with me.  If you need anything prior to your next visit please call the clinic.  Please Bring all medications or accurate medication list with you to each appointment; an accurate medication list is essential in providing you the best care possible.

## 2012-10-22 NOTE — Telephone Encounter (Signed)
Called and spoke with patient.  She is feeling some better with Zofran.  Her abdominal pain is the same.  Counseled her that she can use Tylenol Motrin if needed.  If she needs more significant pain relief she will need to be seen in the emergency department [facility].  Her labs are unremarkable at this time and essentially unchanged from previous.  Discussed this does not rule out appendicitis and she should still have a low threshold to be evaluated with a CT scan or ultrasound if her pain significantly worsens.  Unfortunately we were not able to get a urinalysis on her due to the patient not being able to produce enough urine.  if she continues to have symptoms this will also need to be reinvestigated.

## 2012-10-22 NOTE — Assessment & Plan Note (Signed)
Recently started on Synthroid.  This is likely not causing her abdominal pain however this must be considered.

## 2012-10-22 NOTE — Telephone Encounter (Signed)
Patient was seen today by Dr. Berline Chough in the office. She has a medical history of Turner's syndrome. She was recently started on synthroid and estrogen therapy.  It appears from the notes there was a possible concern for appendicitis, with her RLQ pain in office. Her WBC, Lipase  was normal today in the office. She has been afebrile. Her pain is about the same, however she has been taking pain medications. She has now become incontinent with urine. Urine preg was negative and Urine cytology was completed but not resulted. Does not appear urine dipstick was performed, mild dysuria reported. Instructed patient to come into ED to be evaluated immediately.

## 2012-10-29 ENCOUNTER — Telehealth: Payer: Self-pay | Admitting: Family Medicine

## 2012-10-29 ENCOUNTER — Telehealth: Payer: Self-pay | Admitting: Sports Medicine

## 2012-10-29 NOTE — Telephone Encounter (Signed)
Attempted to return call - left message that message was not clear as to what she needed but if she believed that further evaluation was necessary and she was having continued issues with "abnormal EKG" then she should proceed to the the ED for further eval.  Wyatt Haste, RN-BSN

## 2012-10-29 NOTE — Telephone Encounter (Signed)
[facility] After Hours Line.   Abdominal pain, palpitations unchanged from last visit 8/8 with Dr. Berline Chough. Went to urgent care today and was told she had at prolongation on EKG and to avoid zofran which i agreed with. Advised patient to go to [facility] ED which is close to her and that is her preference. She is not sure if she will go. Told patient if she does not go she should follow up with [place] next week. She thinks things havent gotten better because of hormone replacement and told her she could stop this if she desired.   Will forward to PCP for review.   Aldine Contes. Marti Sleigh, MD, PGY2 10/29/2012 7:43 PM

## 2012-10-29 NOTE — Telephone Encounter (Signed)
See phone note from Doctor Durene Cal - she does have a history of bipolar disorder and and concern that she may be becoming manic.  Doctor Durene Cal has encouraged her to stop her hormone replacement and be evaluated in the emergency department.  I agree with this plan and will plan to see her back in clinic as soon as possible.

## 2012-10-29 NOTE — Telephone Encounter (Signed)
Pt returns call and states that MD at urgent care informed her that she has prolonged QT waves . Pt is complaining of sweating and fast heart rate,nausea - rambling, unable to get a word in conversation, giving life hx of medical problems, meds are not working r/t stress. Pt talks for 20 + minutes. Recommended that she call therapist and relate that meds are not working she is having difficult time dealing with stress - if still not better then to call clinic and make appointment. Pt verbalized understanding. Wyatt Haste, RN-BSN

## 2012-10-29 NOTE — Telephone Encounter (Signed)
Pt just went to urgent care because she is not feeling well.  She was given a medicine at Urgent Care but was told the medicine would have interacted with other meds and stopped her heart. Her EKG showed something abnormal. Please advise

## 2012-10-29 NOTE — Telephone Encounter (Signed)
Agree with stopping hormone replacement Pt also started on synthroid recently for hypothyroidism.  Less likely causing severe symptoms but will need to be monitored closely.  Agree with evaluation at [facility].

## 2012-11-16 ENCOUNTER — Other Ambulatory Visit: Payer: Self-pay | Admitting: *Deleted

## 2012-11-26 ENCOUNTER — Ambulatory Visit: Payer: PRIVATE HEALTH INSURANCE | Admitting: Endocrinology

## 2012-11-26 ENCOUNTER — Telehealth: Payer: Self-pay | Admitting: Sports Medicine

## 2012-11-26 ENCOUNTER — Telehealth: Payer: Self-pay | Admitting: Endocrinology

## 2012-11-26 NOTE — Telephone Encounter (Signed)
Was under the impression the patient has stopped this medicine both directions from Dr. Durene Cal last month.  She will need a followup for any further refills and discussed our options.

## 2012-11-26 NOTE — Telephone Encounter (Signed)
Needs RX filled for premarin. She has it filled at [facility]

## 2012-12-03 ENCOUNTER — Ambulatory Visit: Payer: PRIVATE HEALTH INSURANCE | Admitting: Endocrinology

## 2012-12-03 DIAGNOSIS — Z0289 Encounter for other administrative examinations: Secondary | ICD-10-CM

## 2012-12-22 ENCOUNTER — Encounter: Payer: Self-pay | Admitting: Family Medicine

## 2012-12-22 ENCOUNTER — Ambulatory Visit (INDEPENDENT_AMBULATORY_CARE_PROVIDER_SITE_OTHER): Payer: PRIVATE HEALTH INSURANCE | Admitting: Family Medicine

## 2012-12-22 VITALS — BP 135/102 | HR 90 | Temp 98.3°F | Ht 62.0 in | Wt 219.0 lb

## 2012-12-22 DIAGNOSIS — J029 Acute pharyngitis, unspecified: Secondary | ICD-10-CM

## 2012-12-22 NOTE — Patient Instructions (Signed)
Dear Ms. Loomer,   Your strep test was negative. I think he likely have a bad viral respiratory illness. It may take another week to improve. Continue to stay well hydrated and take cough meds as needed. Your lungs sound clear right now, but if you have wheezing or shortness of breath please let us know.   Sincerely,  Dr. Clinton Sawyer

## 2012-12-22 NOTE — Progress Notes (Signed)
  Subjective:    Patient ID: Holly Richards, female    DOB: [birth year], 32 y.o.   MRN: [id number]  HPI  32 year old F who presents for evaluation of cough and congestion. 1 week duration. Feels like she is worsening. She also has sore throat and hoarseness. She went to the ED at [facility] for her illness and Sunday and had a chest X-ray that was negative for pneumonia and gave her a nebulized "breathing treatment." She does not know the medication that was used. She at times feel lie the "wind is being knocked out of her." Claims to run a normal temp of 94-95, since she claims that anything above that is a "fever." Has not had a flu vaccine this year.     Review of Systems Positive for hair loss    Objective:   Physical Exam  Constitutional: She appears well-developed and well-nourished.  Ill-appearing and persistently coughing in the room  HENT:  Head: Normocephalic and atraumatic.  Right Ear: External ear normal.  Left Ear: External ear normal.  Nose: Nose normal.  Mouth/Throat: Oropharynx is clear and moist. No oropharyngeal exudate.  Eyes: Conjunctivae and EOM are normal. Pupils are equal, round, and reactive to light. Right eye exhibits no discharge. Left eye exhibits no discharge.  Neck: Normal range of motion. Neck supple.  Cardiovascular: Normal rate, regular rhythm and normal heart sounds.   Pulmonary/Chest: Effort normal and breath sounds normal. No respiratory distress.  Skin: Skin is warm.  diaphoretic   BP 135/102  Pulse 90  Temp(Src) 98.3 F (36.8 C) (Oral)  Ht 5\' 2"  (1.575 m)  Wt 219 lb (99.338 kg)  BMI 40.05 kg/m2  SpO2 100%        Assessment & Plan:  Patient with respiratory illness with cough that is likely viral, given her negative strep test. She will continue supportive management return to work when feeling better. No need for follow up unless she worsens.

## 2013-01-07 ENCOUNTER — Other Ambulatory Visit: Payer: PRIVATE HEALTH INSURANCE

## 2013-01-10 ENCOUNTER — Other Ambulatory Visit (INDEPENDENT_AMBULATORY_CARE_PROVIDER_SITE_OTHER): Payer: PRIVATE HEALTH INSURANCE

## 2013-01-10 DIAGNOSIS — R7309 Other abnormal glucose: Secondary | ICD-10-CM

## 2013-01-10 DIAGNOSIS — Z79899 Other long term (current) drug therapy: Secondary | ICD-10-CM

## 2013-01-10 DIAGNOSIS — R7303 Prediabetes: Secondary | ICD-10-CM

## 2013-01-10 LAB — LIPID PANEL
HDL: 36.8 mg/dL — ABNORMAL LOW (ref >39.00)
Triglycerides: 529 mg/dL — ABNORMAL HIGH (ref 0.0–149.0)
VLDL: 105.8 mg/dL — ABNORMAL HIGH (ref 0.0–40.0)

## 2013-01-10 LAB — LDL CHOLESTEROL, DIRECT: Direct LDL: 188.8 mg/dL

## 2013-01-14 ENCOUNTER — Ambulatory Visit: Payer: PRIVATE HEALTH INSURANCE | Admitting: Endocrinology

## 2013-01-18 ENCOUNTER — Encounter: Payer: PRIVATE HEALTH INSURANCE | Admitting: Sports Medicine

## 2013-01-20 ENCOUNTER — Encounter: Payer: Self-pay | Admitting: Endocrinology

## 2013-01-20 ENCOUNTER — Ambulatory Visit (INDEPENDENT_AMBULATORY_CARE_PROVIDER_SITE_OTHER): Payer: PRIVATE HEALTH INSURANCE | Admitting: Endocrinology

## 2013-01-20 VITALS — BP 128/70 | HR 118 | Temp 97.7°F | Resp 10 | Wt 216.0 lb

## 2013-01-20 DIAGNOSIS — R7309 Other abnormal glucose: Secondary | ICD-10-CM

## 2013-01-20 DIAGNOSIS — E785 Hyperlipidemia, unspecified: Secondary | ICD-10-CM

## 2013-01-20 DIAGNOSIS — E039 Hypothyroidism, unspecified: Secondary | ICD-10-CM

## 2013-01-20 DIAGNOSIS — E230 Hypopituitarism: Secondary | ICD-10-CM

## 2013-01-20 DIAGNOSIS — R7303 Prediabetes: Secondary | ICD-10-CM

## 2013-01-20 MED ORDER — ATORVASTATIN CALCIUM 10 MG PO TABS: 10.0000 mg | ORAL_TABLET | Freq: Every day | ORAL | Status: DC

## 2013-01-20 NOTE — Progress Notes (Signed)
Patient ID: Holly Richards, female   DOB: June 16, 1980, 32 y.o.   MRN: [id number]  Reason for Appointment:  Hypothyroidism, new visit   History of Present Illness:   Problem 1: The Hyothyroidism was first diagnosed in 2009    The symptoms consistent with hypothyroidism  at the time of diagnosis were: fatigue 5 yrs, feeling sleepy but no other typical symptoms. 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 HYPERLIPIDEMIA: She has had known abnormal lipids, primarily high triglycerides and apparently a direct LDL has not been calculated previously. Also has not been on any medications for this Current results are as follows:  No visits with results within 1 Week(s) from this visit. Latest known visit with results is:  Appointment on 01/10/2013  Component Date Value Range Status  . Cholesterol 01/10/2013 298* 0 - 200 mg/dL Final   ATP III Classification       Desirable:  < 200 mg/dL               Borderline High:  200 - 239 mg/dL          High:  > = 409  mg/dL  . Triglycerides 01/10/2013 529.0* 0.0 - 149.0 mg/dL Final   Normal:  <811 mg/dLBorderline High:  150 - 199 mg/dLTriglyceride is over 400; calculations on Lipids are invalid.  Marland Kitchen HDL 01/10/2013 36.80* >39.00 mg/dL Final  . VLDL 91/47/8295 105.8* 0.0 - 40.0 mg/dL Final  . Total CHOL/HDL Ratio 01/10/2013 8   Final                  Men          Women1/2 Average Risk     3.4          3.3Average Risk          5.0          4.42X Average Risk          9.6          7.13X Average Risk          15.0          11.0                      . Direct LDL 01/10/2013 188.8   Final   Optimal:  <100 mg/dLNear or Above Optimal:  100-129 mg/dLBorderline High:  130-159 mg/dLHigh:  160-189 mg/dLVery High:  >190 mg/dL    Past Medical History  Diagnosis Date  . Anxiety   .  Bipolar disorder   . Migraines   . Thyroid disease     Past Surgical History  Procedure Laterality Date  . Cholecystectomy    . Colonoscopy    . Corneal transplant      Family History  Problem Relation Age of Onset  . Autoimmune disease Mother   . Alcohol abuse Father   . Hyperlipidemia Father   . Hypertension Father   . Amenorrhea Brother   . Diabetes Maternal Grandmother   . Diabetes Maternal Grandfather    RA mom No thyroid  Social History:  reports that she has never smoked. She does not have any smokeless tobacco history on file. Her alcohol and drug histories are not on file.  Allergies:  Allergies  Allergen Reactions  . Tamiflu       Medication List       This list is accurate as of: 01/20/13  4:19 PM.  Always use your most recent med list.               albuterol 108 (90 BASE) MCG/ACT inhaler  Commonly known as:  PROVENTIL HFA;VENTOLIN HFA  Inhale two to 4 puffs every 6 hours as needed for shortness or breath, cough, or wheezing.     estrogens (conjugated) 0.3 MG tablet  Commonly known as:  PREMARIN  Take 0.3 mg by mouth daily. Take daily for 21 days then do not take for 7 days.     levothyroxine 50  MCG tablet  Commonly known as:  SYNTHROID, LEVOTHROID  Take 1 tablet (50 mcg total) by mouth daily.     ondansetron 4 MG disintegrating tablet  Commonly known as:  ZOFRAN ODT  Take 1 tablet (4 mg total) by mouth every 8 (eight) hours as needed for nausea.     risperiDONE 1 MG tablet  Commonly known as:  RISPERDAL  Take 1 tablet (1 mg total) by mouth daily.     sertraline 100 MG tablet  Commonly known as:  ZOLOFT  Take 100 mg by mouth daily.     traZODone 150 MG tablet  Commonly known as:  DESYREL  Take 0.5 tablets (75 mg total) by mouth at bedtime.        Review of Systems:  She has a mild history of asthma She has no  history of high blood pressure.            GASTROENTEROLOGY: She has known Celiac disease and tends to have loose stools     ENDOCRINOLOGY:  no history of Diabetes but has had mild increase in glucose levels.     She has a positive history of depression         Examination:    BP 128/70  Pulse 118  Temp(Src) 97.7 F (36.5 C) (Oral)  Resp 10  Wt 216 lb (97.977 kg)  SpO2 97%  Neurological: REFLEXES: at biceps are slightly slow but difficult to elicit       Assessments and Plan:   1. 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 PREDIABETES: She has had mild increase in glucose levels  and will need to check an A1c  Has difficulty losing weight May consider metformin if A1c over 6%  4. She has hyperlipidemia and fatty liver related to metabolic syndrome. Her triglycerides are over 500. Also her LDL is significantly high at 189 Because of her metabolic syndrome and borderline glucose levels she is at high risk for coronary disease long-term and will need to start her on lipid-lowering therapy along with diet. Explained to her the importance of lipid control  weight loss Also will  start her on Lipitor 10 mg daily    Jeffree Cazeau 01/20/2013, 4:19 PM   Office Visit on 01/20/2013  Component Date Value Range Status  . Free T4 01/20/2013 0.49* 0.60 - 1.60 ng/dL Final  . TSH 46/96/2952 10.77* 0.35 - 5.50 uIU/mL Final  . Hemoglobin A1C 01/20/2013 7.1* 4.6 - 6.5 % Final   Glycemic Control Guidelines for People with Diabetes:Non Diabetic:  <6%Goal of Therapy: <7%Additional Action Suggested:  >8%

## 2013-01-21 LAB — TSH: TSH: 10.77 u[IU]/mL — ABNORMAL HIGH (ref 0.35–5.50)

## 2013-01-21 LAB — HEMOGLOBIN A1C: Hgb A1c MFr Bld: 7.1 % — ABNORMAL HIGH (ref 4.6–6.5)

## 2013-01-24 ENCOUNTER — Telehealth: Payer: Self-pay | Admitting: *Deleted

## 2013-01-24 NOTE — Telephone Encounter (Signed)
[facility] called and wanted you to know they were unable to run the Somatomedine test you ordered because they did not receive a frozen sample, they have to cancel that test, but said you could redraw it and they would run it again.

## 2013-01-26 ENCOUNTER — Telehealth: Payer: Self-pay | Admitting: Sports Medicine

## 2013-01-26 ENCOUNTER — Other Ambulatory Visit: Payer: Self-pay | Admitting: *Deleted

## 2013-01-26 MED ORDER — ONETOUCH VERIO IQ SYSTEM W/DEVICE KIT: 1.0000 | PACK | Freq: Every day | Status: DC

## 2013-01-26 MED ORDER — PIOGLITAZONE HCL-METFORMIN HCL 15-500 MG PO TABS: 1.0000 | ORAL_TABLET | Freq: Every day | ORAL | Status: DC

## 2013-01-26 MED ORDER — GLUCOSE BLOOD VI STRP: ORAL_STRIP | Status: DC

## 2013-01-26 MED ORDER — ONETOUCH DELICA LANCETS FINE MISC: 1.0000 | Freq: Every day | Status: DC

## 2013-01-26 NOTE — Telephone Encounter (Signed)
The patient needs an office visit for any further refills.  She has not followed up (multiple no-shows) since this was initially prescribed and our last telephone notes indicate that she had stopped this medication due to side effects.  Also, I see that she has seen endocrinology and they recommend a different formulation either way and would like to have levels with her off of this medication.  I was unaware she was continuing to take this until I received endocrinology's most recent note.  Please call and inform she will need an appointment with me to further discuss ongoing supplementation.  Also, this was an empiric treatment due to the severity of her vasomotor (hot flashes) and it would likely be more appropriate for endocrinology to be prescribing this going forward given their expertise in this area.

## 2013-01-26 NOTE — Telephone Encounter (Signed)
Pt called and would like a refill on her Premarin called into her pharmacy on file. JW

## 2013-01-27 ENCOUNTER — Telehealth: Payer: Self-pay | Admitting: *Deleted

## 2013-01-27 NOTE — Telephone Encounter (Signed)
Dr Lucianne Muss will address all of her thyroid issues and medication. [name], Virgel Bouquet

## 2013-01-27 NOTE — Telephone Encounter (Signed)
Pt spoke with her PCP Dr. Berline Chough who said they want you to prescribe her hormone replacement Premarin, she said she does not want the patch.

## 2013-01-27 NOTE — Telephone Encounter (Signed)
Would prefer a patch because the tablets will increase her triglycerides, need to send CombiPatch 0.05/0.14 twice a week

## 2013-01-28 ENCOUNTER — Other Ambulatory Visit: Payer: Self-pay | Admitting: *Deleted

## 2013-01-28 MED ORDER — ESTRADIOL-NORETHINDRONE ACET 0.05-0.14 MG/DAY TD PTTW: 1.0000 | MEDICATED_PATCH | TRANSDERMAL | Status: DC

## 2013-01-28 NOTE — Telephone Encounter (Signed)
Patient got upset thinking Dr Berline Chough didn't want to see her any longer.I explain that's  the  Opposite of what Dr Berline Chough wants.I again explained that in order to refill her medications she's needing to see Dr Berline Chough.she stated that she's been missing his appointment because she was under the impression that Dr Lucianne Muss was taking over her care and to make sure to explain to Dr Berline Chough why she was No showing,also states she has a follow up appointment on 02/08/2013 with Dr Berline Chough. Holly Richards, Virgel Bouquet

## 2013-01-28 NOTE — Telephone Encounter (Signed)
Pt is aware, rx sent 

## 2013-02-08 ENCOUNTER — Encounter: Payer: Self-pay | Admitting: Sports Medicine

## 2013-02-08 ENCOUNTER — Ambulatory Visit (INDEPENDENT_AMBULATORY_CARE_PROVIDER_SITE_OTHER): Payer: PRIVATE HEALTH INSURANCE | Admitting: Sports Medicine

## 2013-02-08 VITALS — BP 142/96 | HR 95 | Temp 99.0°F | Ht 63.0 in | Wt 224.0 lb

## 2013-02-08 DIAGNOSIS — E669 Obesity, unspecified: Secondary | ICD-10-CM

## 2013-02-08 DIAGNOSIS — Q969 Turner's syndrome, unspecified: Secondary | ICD-10-CM

## 2013-02-08 DIAGNOSIS — R030 Elevated blood-pressure reading, without diagnosis of hypertension: Secondary | ICD-10-CM

## 2013-02-08 DIAGNOSIS — Z299 Encounter for prophylactic measures, unspecified: Secondary | ICD-10-CM

## 2013-02-08 DIAGNOSIS — R7309 Other abnormal glucose: Secondary | ICD-10-CM

## 2013-02-08 DIAGNOSIS — R7303 Prediabetes: Secondary | ICD-10-CM

## 2013-02-08 DIAGNOSIS — R61 Generalized hyperhidrosis: Secondary | ICD-10-CM

## 2013-02-08 NOTE — Progress Notes (Signed)
Holly Richards - 32 y.o. female MRN [id number]  Date of birth: [birth year]  CC, HPI, Interval History & ROS  Jason is here today to followup on her chronic medical conditions including:  Turner syndrome, celiac disease, hypothyroid, bipolar disorder,.  Hyperlipidemia  She reports that overall she seems to be feeling significantly better since being on estrogen therapy.  Dr. Lucianne Muss has started her on Actos plus , metformin Lipitor, Synthroid.    She has been checking sugars - 104 today.  140s-190.  Fasting glucose: 190s to 200s occasionally.    Feeling significantly better.  No further hot flashes.  BP has been elevated at work, normal systolic and elevated diastolic 110s to 010X.  She has had a marked amount of weight gain which can be associated with Actos.  She was confused and thought that she would no longer be coming to family practice that is why she has no showed multiple appointments.  She reports overall the mood is significantly improved and stable at this time.  She continues to see psychiatry.  She reports her lipids were checked for work insurance and were less than 500 for the first time ever.  She is not having menstrual periods  She reports compliance with her gluten-free diet but has been indulging in gluten-free sweets  Other acute problems include:  none  Pertinent History & Care Coordination  No Patient Care Coordination Note on file.  History  Smoking status  . Never Smoker   Smokeless tobacco  . Not on file   Health Maintenance Due  Topic  . Pap Smear   . Influenza Vaccine     Recent Labs  09/02/12 1643 09/14/12 1701 01/10/13 1111 01/20/13 1638  HGBA1C  --   --   --  7.1*  TRIG  --  520* 529.0*  --   CHOL  --  293* 298*  --   HDL  --  36* 36.80*  --   TSH 13.320*  --   --  10.77*     Otherwise past Medical, Surgical, Social, and Family History Reviewed per EMR Medications and Allergies reviewed and all updated if necessary. Objective  Findings  VITALS: HR: 95 bpm  BP: 142/96 mmHg  TEMP: 99 F (37.2 C) (Oral)  RESP:    HT: 5\' 3"  (160 cm)  WT: 224 lb (101.606 kg)  BMI: 39.8   BP Readings from Last 3 Encounters:  02/08/13 142/96  01/20/13 128/70  12/22/12 135/102   Wt Readings from Last 3 Encounters:  02/08/13 224 lb (101.606 kg)  01/20/13 216 lb (97.977 kg)  12/22/12 219 lb (99.338 kg)     PHYSICAL EXAM: GENERAL: adult female  female. In no discomfort; no respiratory distress  PSYCH: alert and appropriate, good insight   HNEENT:  webbed neck, no JVD, no nasal discharge, tympanic membranes pearly gray   CARDIO: RRR, S1/S2 heard, no murmur  LUNGS: CTA B, no wheezes, no crackles  ABDOMEN: +BS, soft, non-tender, no rigidity, no guarding, no masses/hepatosplenomegaly  EXTREM:  Warm, well perfused.  Moves all 4 extremities spontaneously; no lateralization. Distal pulses 2+/4.  no pretibial edema.  GU:   SKIN:     Assessment & Plan   Problems addressed today: General Plan & Pt Instructions:  No diagnosis found.   Exercise for 30 minutes daily outside of work - breathing somewhat heavy In Jan we will plan on doing a PAP smear and recheck BP in [place] Blood work in [employer]  For further discussion of A/P and for follow up issues see problem based charting.

## 2013-02-08 NOTE — Patient Instructions (Signed)
Exercise for 30 minutes daily outside of work - breathing somewhat heavy In Jan we will plan on doing a PAP smear and recheck BP in [place] Blood work in [employer]

## 2013-02-09 DIAGNOSIS — R030 Elevated blood-pressure reading, without diagnosis of hypertension: Secondary | ICD-10-CM | POA: Insufficient documentation

## 2013-02-09 DIAGNOSIS — Z299 Encounter for prophylactic measures, unspecified: Secondary | ICD-10-CM | POA: Insufficient documentation

## 2013-02-09 NOTE — Assessment & Plan Note (Signed)
Patient to return in January for recheck. Discussed if remains elevated will need medication Encouraged TLC (Therapeutic Lifestyle Changes)

## 2013-02-09 NOTE — Assessment & Plan Note (Signed)
Continues to gain weight, some is likely due to starting Actos. Encouraged exercise

## 2013-02-09 NOTE — Assessment & Plan Note (Signed)
>   Follow up patient needs DEXA scan

## 2013-02-09 NOTE — Assessment & Plan Note (Signed)
>   Follow up Pap smear at January appointment

## 2013-02-09 NOTE — Assessment & Plan Note (Signed)
Significantly improved.  On appropriate medications.  I appreciate Dr. Lucianne Muss managing her endocrine needs.

## 2013-02-09 NOTE — Assessment & Plan Note (Signed)
A1c of 7.1 Encourage TLC (Therapeutic Lifestyle Changes)

## 2013-03-02 ENCOUNTER — Other Ambulatory Visit: Payer: Self-pay | Admitting: Endocrinology

## 2013-03-02 ENCOUNTER — Other Ambulatory Visit: Payer: Self-pay | Admitting: Sports Medicine

## 2013-03-02 DIAGNOSIS — E785 Hyperlipidemia, unspecified: Secondary | ICD-10-CM

## 2013-03-02 MED ORDER — ATORVASTATIN CALCIUM 10 MG PO TABS: 10.0000 mg | ORAL_TABLET | Freq: Every day | ORAL | Status: DC

## 2013-05-08 ENCOUNTER — Encounter (HOSPITAL_COMMUNITY): Payer: Self-pay | Admitting: Emergency Medicine

## 2013-05-08 ENCOUNTER — Emergency Department (HOSPITAL_COMMUNITY): Payer: No Typology Code available for payment source

## 2013-05-08 ENCOUNTER — Emergency Department (HOSPITAL_COMMUNITY)
Admission: EM | Admit: 2013-05-08 | Discharge: 2013-05-08 | Disposition: A | Discharge status: Home / Self Care | Payer: No Typology Code available for payment source | Attending: Emergency Medicine | Admitting: Emergency Medicine

## 2013-05-08 DIAGNOSIS — M259 Joint disorder, unspecified: Secondary | ICD-10-CM | POA: Insufficient documentation

## 2013-05-08 DIAGNOSIS — Y939 Activity, unspecified: Secondary | ICD-10-CM | POA: Insufficient documentation

## 2013-05-08 DIAGNOSIS — M542 Cervicalgia: Secondary | ICD-10-CM

## 2013-05-08 DIAGNOSIS — F411 Generalized anxiety disorder: Secondary | ICD-10-CM | POA: Insufficient documentation

## 2013-05-08 DIAGNOSIS — T07XXXA Unspecified multiple injuries, initial encounter: Secondary | ICD-10-CM | POA: Insufficient documentation

## 2013-05-08 DIAGNOSIS — F319 Bipolar disorder, unspecified: Secondary | ICD-10-CM | POA: Insufficient documentation

## 2013-05-08 DIAGNOSIS — M25519 Pain in unspecified shoulder: Secondary | ICD-10-CM

## 2013-05-08 DIAGNOSIS — Z79899 Other long term (current) drug therapy: Secondary | ICD-10-CM | POA: Insufficient documentation

## 2013-05-08 DIAGNOSIS — Z887 Allergy status to serum and vaccine status: Secondary | ICD-10-CM | POA: Insufficient documentation

## 2013-05-08 DIAGNOSIS — E079 Disorder of thyroid, unspecified: Secondary | ICD-10-CM | POA: Insufficient documentation

## 2013-05-08 DIAGNOSIS — G43909 Migraine, unspecified, not intractable, without status migrainosus: Secondary | ICD-10-CM | POA: Insufficient documentation

## 2013-05-08 MED ORDER — MORPHINE SULFATE 4 MG/ML IJ SOLN
4.0000 mg | Freq: Once | INTRAMUSCULAR | Status: AC
  Administered 2013-05-08: 4 mg via INTRAVENOUS
  Filled 2013-05-08: qty 1

## 2013-05-08 MED ORDER — METHOCARBAMOL 500 MG PO TABS: 500.0000 mg | ORAL_TABLET | Freq: Two times a day (BID) | ORAL | Status: DC | PRN

## 2013-05-08 MED ORDER — ONDANSETRON HCL 4 MG/2ML IJ SOLN
4.0000 mg | Freq: Once | INTRAMUSCULAR | Status: AC
  Administered 2013-05-08: 4 mg via INTRAVENOUS
  Filled 2013-05-08: qty 2

## 2013-05-08 MED ORDER — OXYCODONE-ACETAMINOPHEN 5-325 MG PO TABS: 1.0000 | ORAL_TABLET | ORAL | Status: DC | PRN

## 2013-05-08 NOTE — ED Notes (Signed)
PT ambulated with baseline gait; VSS; A&Ox3; no signs of distress; respirations even and unlabored; skin warm and dry; no questions upon discharge.  

## 2013-05-08 NOTE — ED Provider Notes (Signed)
Medical screening examination/treatment/procedure(s) were performed by non-physician practitioner and as supervising physician I was immediately available for consultation/collaboration.  EKG Interpretation   None      Pt was not discussed with me  Rolland Porter, MD, Abram Sander   Janice Norrie, MD 05/08/13 [phone]
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
## 2013-05-08 NOTE — ED Notes (Signed)
Pt to radiology.

## 2013-05-08 NOTE — Discharge Instructions (Signed)
Take the prescribed medication as directed.  Do not drive while taking this medication. Follow-up with your primary care physician as needed. Return to the ED for new or worsening symptoms.

## 2013-05-09 ENCOUNTER — Telehealth: Payer: Self-pay | Admitting: Sports Medicine

## 2013-05-09 NOTE — Telephone Encounter (Signed)
Patient was advised to follow up with Dr Ernst Spell states she has no way to get here today,will schedule appointment for tomorrow that would give her enough time to get transportation.Aundrey Elahi, Lewie Loron
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
## 2013-05-10 NOTE — Telephone Encounter (Signed)
If pt is having frequent recurrent vomiting with headaches she needs to be seen for a CT scan of the head.  It most likely is a medication effect but if symptoms are persisting CT scan of the head is warranted per [place] CT Head rules.  Please instruct her to follow up in the ED if symptoms are worsening and she is unable to be seen

## 2013-05-11 ENCOUNTER — Emergency Department (HOSPITAL_COMMUNITY): Payer: PRIVATE HEALTH INSURANCE

## 2013-05-11 ENCOUNTER — Encounter (HOSPITAL_COMMUNITY): Payer: Self-pay | Admitting: Emergency Medicine

## 2013-05-11 ENCOUNTER — Emergency Department (HOSPITAL_COMMUNITY)
Admission: EM | Admit: 2013-05-11 | Discharge: 2013-05-11 | Disposition: A | Discharge status: Home / Self Care | Payer: PRIVATE HEALTH INSURANCE | Attending: Emergency Medicine | Admitting: Emergency Medicine

## 2013-05-11 DIAGNOSIS — F319 Bipolar disorder, unspecified: Secondary | ICD-10-CM | POA: Insufficient documentation

## 2013-05-11 DIAGNOSIS — Z79899 Other long term (current) drug therapy: Secondary | ICD-10-CM | POA: Diagnosis not present

## 2013-05-11 DIAGNOSIS — Z8679 Personal history of other diseases of the circulatory system: Secondary | ICD-10-CM | POA: Diagnosis not present

## 2013-05-11 DIAGNOSIS — E079 Disorder of thyroid, unspecified: Secondary | ICD-10-CM | POA: Insufficient documentation

## 2013-05-11 DIAGNOSIS — Y9389 Activity, other specified: Secondary | ICD-10-CM | POA: Insufficient documentation

## 2013-05-11 DIAGNOSIS — S298XXA Other specified injuries of thorax, initial encounter: Secondary | ICD-10-CM | POA: Diagnosis present

## 2013-05-11 DIAGNOSIS — Y92410 Unspecified street and highway as the place of occurrence of the external cause: Secondary | ICD-10-CM | POA: Diagnosis not present

## 2013-05-11 DIAGNOSIS — S20219A Contusion of unspecified front wall of thorax, initial encounter: Secondary | ICD-10-CM | POA: Diagnosis not present

## 2013-05-11 DIAGNOSIS — F411 Generalized anxiety disorder: Secondary | ICD-10-CM | POA: Diagnosis not present

## 2013-05-11 MED ORDER — IBUPROFEN 800 MG PO TABS: 800.0000 mg | ORAL_TABLET | Freq: Three times a day (TID) | ORAL | Status: DC | PRN

## 2013-05-11 MED ORDER — CYCLOBENZAPRINE HCL 10 MG PO TABS: 10.0000 mg | ORAL_TABLET | Freq: Three times a day (TID) | ORAL | Status: DC | PRN

## 2013-05-11 MED ORDER — HYDROCODONE-ACETAMINOPHEN 5-325 MG PO TABS
1.0000 | ORAL_TABLET | Freq: Once | ORAL | Status: AC
  Administered 2013-05-11: 1 via ORAL
  Filled 2013-05-11: qty 1

## 2013-05-11 MED ORDER — IBUPROFEN 400 MG PO TABS
800.0000 mg | ORAL_TABLET | Freq: Once | ORAL | Status: AC
  Administered 2013-05-11: 800 mg via ORAL
  Filled 2013-05-11: qty 2

## 2013-05-11 MED ORDER — OXYCODONE-ACETAMINOPHEN 5-325 MG PO TABS: 1.0000 | ORAL_TABLET | Freq: Four times a day (QID) | ORAL | Status: DC | PRN

## 2013-05-11 NOTE — ED Provider Notes (Signed)
Medical screening examination/treatment/procedure(s) were performed by non-physician practitioner and as supervising physician I was immediately available for consultation/collaboration.  EKG Interpretation   None         Delice Bison Ward, DO 05/11/13 [phone]
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
## 2013-05-11 NOTE — Telephone Encounter (Signed)
Patient calls again to inform us that she's at the ED.She was under the impression that we were to order CT.I again relayed message that Dr Paulla Fore thought if symptoms persisted that it would be best to go to the ED just in case a CT was needed to rule out any head trama.Patient again voiced understanding. Mandy Fitzwater, Lewie Loron

## 2013-05-11 NOTE — Telephone Encounter (Signed)
Relayed message,states are worse and that she's in too much pain to drive.I advised her to find a family member to bring her to the ED.she voiced understanding. Mesha Schamberger, Lewie Loron
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
## 2013-05-11 NOTE — Discharge Instructions (Signed)
Return here as needed.  Followup with your primary care Dr. for recheck.  Use ice and heat on your chest wall

## 2013-10-20 ENCOUNTER — Other Ambulatory Visit: Payer: Self-pay | Admitting: Endocrinology

## 2013-10-24 ENCOUNTER — Other Ambulatory Visit: Payer: Self-pay | Admitting: Endocrinology

## 2013-10-24 ENCOUNTER — Encounter: Payer: Self-pay | Admitting: Endocrinology

## 2013-10-24 ENCOUNTER — Ambulatory Visit (INDEPENDENT_AMBULATORY_CARE_PROVIDER_SITE_OTHER): Payer: PRIVATE HEALTH INSURANCE | Admitting: Endocrinology

## 2013-10-24 VITALS — BP 126/93 | HR 91 | Temp 98.3°F | Resp 16 | Ht 63.0 in | Wt 219.0 lb

## 2013-10-24 DIAGNOSIS — Q969 Turner's syndrome, unspecified: Secondary | ICD-10-CM

## 2013-10-24 DIAGNOSIS — E230 Hypopituitarism: Secondary | ICD-10-CM

## 2013-10-24 DIAGNOSIS — R7309 Other abnormal glucose: Secondary | ICD-10-CM

## 2013-10-24 DIAGNOSIS — E785 Hyperlipidemia, unspecified: Secondary | ICD-10-CM

## 2013-10-24 DIAGNOSIS — E039 Hypothyroidism, unspecified: Secondary | ICD-10-CM

## 2013-10-24 DIAGNOSIS — R7303 Prediabetes: Secondary | ICD-10-CM

## 2013-10-24 LAB — HEMOGLOBIN A1C: Hgb A1c MFr Bld: 6.8 % — ABNORMAL HIGH (ref 4.6–6.5)

## 2013-10-24 NOTE — Progress Notes (Signed)
Patient ID: Holly Richards, female   DOB: 1981-01-18, 33 y.o.   MRN: [id number]   Reason for Appointment: Multiple endocrine problems   History of Present Illness:   Problem 1:  Hyothyroidism was first diagnosed in 2009    The symptoms consistent with hypothyroidism  at the time of diagnosis were: fatigue 5 yrs, feeling sleepy but no other typical symptoms. Also has had difficulty losing weight She previously has had persistently high TSH level since at least 2009 without any treatment She was started on 50 mcg of levothyroxine on 10/15/12 with improvement in her fatigue However her TSH was still relatively high in 11/14 along with low free T4. She was told to increase her dose to 100 mcg but she did not do so and because of lack of insurance she stop the supplement about 2 months later She is still complaining of fatigue, some hair loss but no cold intolerance. 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  She will also have difficulty sleeping at night because of hot flashes and sweating spells Her FSH was not higher when evaluated in 2014 She had been taking 0.3 mg of Premarin from her PCP She was changed to CombiPatch in 11/14 with relief of her hot flashes and is still using this.  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  Diabetes: Previously had prediabetes but this had progressed and 2014. Her last A1c was 7.1  %. She was started on metformin 500 mg daily as a trial but she did not increase the dose Has not taken this again because of insurance issues and has not followed up She has started checking her blood sugar on her own She thinks her fasting readings are still high at 160 usually, best reading was 134. After meals or similar with highest reading 210 She is trying to exercise for the last month with walking or running but has difficulty losing weight the  Wt Readings from Last 3 Encounters:  10/24/13 219 lb (99.338 kg)  05/08/13 214 lb (97.07 kg)  02/08/13 224 lb (101.606 kg)   Lab Results  Component Value Date   HGBA1C 6.8* 10/24/2013   HGBA1C 7.1* 01/20/2013   HGBA1C 6.5* 11/06/2010   Lab Results  Component Value Date   LDLCALC Comment:   Not calculated due to Triglyceride >400. Suggest ordering Direct LDL (Unit Code: [phone]).   Total Cholesterol/HDL Ratio:CHD Risk                        Coronary Heart Disease Risk Table                                        Men       Women  1/2 Average Risk              3.4        3.3              Average Risk              5.0        4.4           2X Average Risk              9.6        7.1           3X Average Risk             23.4       11.0 Use the calculated Patient Ratio above and the CHD Risk table  to determine the patient's CHD Risk. ATP III Classification (LDL):       < 100        mg/dL         Optimal      100 - 129     mg/dL         Near or Above Optimal      130 - 159     mg/dL         Borderline High      160 - 189     mg/dL         High       > 190        mg/dL         Very High   09/14/2012   CREATININE 0.6 10/24/2013    HYPERLIPIDEMIA: She has had known abnormal lipids, primarily high triglycerides and apparently a direct LDL has not been calculated previously. Also has not been taking her Lipitor until 2 days ago when she refilled it   Past Medical History  Diagnosis Date  . Anxiety   . Bipolar disorder   . Migraines   . Thyroid  disease     Past Surgical History  Procedure Laterality Date  . Cholecystectomy    . Colonoscopy    . Corneal transplant      Family History  Problem Relation Age of Onset  . Autoimmune disease Mother   . Alcohol abuse Father   . Hyperlipidemia Father   . Hypertension Father   . Amenorrhea Brother   . Diabetes Maternal Grandmother   . Diabetes Maternal Grandfather    Her mother has rheumatoid arthritis  Social History:  reports that she has never smoked. She does not have any smokeless tobacco history on file. Her alcohol and drug histories are not on file.  Allergies:  Allergies  Allergen Reactions  . Tamiflu Anaphylaxis      Medication List       This list is accurate as of: 10/24/13  4:02 PM.  Always use your most recent med list.               atorvastatin 10 MG tablet  Commonly known as:  LIPITOR  Take 10 mg by mouth daily.     estradiol-norethindrone 0.05-0.14 MG/DAY  Commonly known as:  COMBIPATCH  Place 1 patch onto the skin 2 (two) times a week. *uses on wednesdays, and sundays*     levothyroxine 25 MCG tablet  Commonly known as:  SYNTHROID, LEVOTHROID  Take 25 mcg by mouth daily before breakfast.     metFORMIN 500 MG tablet  Commonly known as:  GLUCOPHAGE  Take 500 mg by mouth daily with breakfast.     MULTIVITAMIN PO  Take 1 tablet by mouth daily.     ziprasidone 20 MG capsule  Commonly known as:  GEODON  Take 20 mg by mouth at bedtime.        Review of Systems:  She has a mild history of asthma She has history of high blood pressure  but currently is not on any medications           GASTROENTEROLOGY: She has known Celiac disease and tends to have loose stools.     She has a  history of atypical depression, is on treatment         Examination:    BP 126/93  Pulse 91  Temp(Src) 98.3 F (36.8 C)  Resp 16  Ht 5\' 3"  (1.6 m)  Wt 219 lb (99.338 kg)  BMI 38.80 kg/m2  SpO2 98%  Mild puffiness of the face present. Her voice is  relatively hoarse Neurological: REFLEXES: at biceps are difficult to elicit but appear normal     No peripheral edema  Assessments and Plan:   1. 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   Will  check her IGF-1 level today to check pituitary function since she had growth hormone treatment before puberty  3.  DIABETES: She has had  increase in glucose levels  and since her A1c was 7.1 likely has diabetes She reports glucose readings over 160 fasting and will need to be on metformin 1 g daily at least Has started exercising to lose weight Consider dietary counseling also  4. She has hyperlipidemia and fatty liver related to metabolic syndrome. Her triglycerides have been over 500. Also her LDL baseline was significantly high at 189 Because of her metabolic syndrome and borderline glucose levels she is at high risk for coronary disease long-term She will continue  on Lipitor 10 mg daily and have fasting lipids checked on followup    Holly Richards 10/24/2013, 4:02 PM   Addendum: A1c 6.8, she will take metformin 500 twice a day  Thyroid levels normal and she can take 25 mcg daily Chemistry shows abnormal liver functions, likely fatty liver  Office Visit on 10/24/2013  Component Date Value Ref Range Status  . Hemoglobin A1C 10/24/2013 6.8* 4.6 - 6.5 % Final   Glycemic Control Guidelines for People with Diabetes:Non Diabetic:  <6%Goal of Therapy: <7%Additional Action Suggested:  >8%   . Sodium 10/24/2013 139  135 - 145 mEq/L Final  .  Potassium 10/24/2013 4.1  3.5 - 5.1 mEq/L Final  . Chloride 10/24/2013 106  96 - 112 mEq/L Final  . CO2 10/24/2013 24  19 - 32 mEq/L Final  . Glucose, Bld 10/24/2013 108* 70 - 99 mg/dL Final  . BUN 10/24/2013 8  6 - 23 mg/dL Final  . Creatinine, Ser 10/24/2013 0.6  0.4 - 1.2 mg/dL Final  . Total Bilirubin 10/24/2013 0.4  0.2 - 1.2 mg/dL Final  . Alkaline Phosphatase 10/24/2013 111  39 - 117 U/L Final  . AST  10/24/2013 49* 0 - 37 U/L Final  . ALT 10/24/2013 87* 0 - 35 U/L Final  . Total Protein 10/24/2013 7.2  6.0 - 8.3 g/dL Final  . Albumin 10/24/2013 3.9  3.5 - 5.2 g/dL Final  . Calcium 10/24/2013 9.2  8.4 - 10.5 mg/dL Final  . GFR 10/24/2013 130.80  >60.00 mL/min Final  . TSH 10/24/2013 3.46  0.35 - 4.50 uIU/mL Final  . Free T4 10/24/2013 0.68  0.60 - 1.60 ng/dL Final

## 2013-10-25 LAB — COMPREHENSIVE METABOLIC PANEL
ALBUMIN: 3.9 g/dL (ref 3.5–5.2)
ALT: 87 U/L — ABNORMAL HIGH (ref 0–35)
AST: 49 U/L — ABNORMAL HIGH (ref 0–37)
Alkaline Phosphatase: 111 U/L (ref 39–117)
BUN: 8 mg/dL (ref 6–23)
CALCIUM: 9.2 mg/dL (ref 8.4–10.5)
CHLORIDE: 106 meq/L (ref 96–112)
CO2: 24 meq/L (ref 19–32)
CREATININE: 0.6 mg/dL (ref 0.4–1.2)
GFR: 130.8 mL/min (ref >60.00)
GLUCOSE: 108 mg/dL — AB (ref 70–99)
Potassium: 4.1 mEq/L (ref 3.5–5.1)
Sodium: 139 mEq/L (ref 135–145)
Total Bilirubin: 0.4 mg/dL (ref 0.2–1.2)
Total Protein: 7.2 g/dL (ref 6.0–8.3)

## 2013-10-25 LAB — TSH: TSH: 3.46 u[IU]/mL (ref 0.35–4.50)

## 2013-10-25 LAB — T4, FREE: Free T4: 0.68 ng/dL (ref 0.60–1.60)

## 2013-10-25 NOTE — Progress Notes (Signed)
Quick Note:  A1c 6.8, she will take metformin 500 twice a day Thyroid levels nearly normal and she needs to take only 25 mcg levothyroxine daily Chemistry shows abnormal liver functions as before Growth hormone level pending ______

## 2013-10-26 LAB — INSULIN-LIKE GROWTH FACTOR: SOMATOMEDIN (IGF-I): 102 ng/mL (ref 66–336)

## 2013-11-14 DIAGNOSIS — H18603 Keratoconus, unspecified, bilateral: Secondary | ICD-10-CM | POA: Insufficient documentation

## 2013-12-05 ENCOUNTER — Encounter: Payer: Self-pay | Admitting: Endocrinology

## 2013-12-05 ENCOUNTER — Ambulatory Visit (INDEPENDENT_AMBULATORY_CARE_PROVIDER_SITE_OTHER): Payer: PRIVATE HEALTH INSURANCE | Admitting: Endocrinology

## 2013-12-05 VITALS — BP 131/96 | HR 88 | Temp 97.7°F | Resp 16 | Ht 63.0 in | Wt 219.2 lb

## 2013-12-05 DIAGNOSIS — R7309 Other abnormal glucose: Secondary | ICD-10-CM

## 2013-12-05 DIAGNOSIS — E039 Hypothyroidism, unspecified: Secondary | ICD-10-CM

## 2013-12-05 DIAGNOSIS — E230 Hypopituitarism: Secondary | ICD-10-CM

## 2013-12-05 DIAGNOSIS — E785 Hyperlipidemia, unspecified: Secondary | ICD-10-CM

## 2013-12-05 DIAGNOSIS — R7989 Other specified abnormal findings of blood chemistry: Secondary | ICD-10-CM

## 2013-12-05 DIAGNOSIS — R7303 Prediabetes: Secondary | ICD-10-CM

## 2013-12-05 MED ORDER — METFORMIN HCL ER 750 MG PO TB24: 1500.0000 mg | ORAL_TABLET | Freq: Every day | ORAL | Status: DC

## 2013-12-05 NOTE — Progress Notes (Signed)
Patient ID: Holly Richards, female   DOB: Sep 10, 1980, 33 y.o.   MRN: [id number]   Reason for Appointment: Multiple endocrine problems   History of Present Illness:   Problem 1:  Hyothyroidism was first diagnosed in 2009    The symptoms consistent with hypothyroidism  at the time of diagnosis were: fatigue 5 yrs, feeling sleepy but no other typical symptoms. Also has had difficulty losing weight She previously has had persistently high TSH level since at least 2009 without any treatment She was started on 50 mcg of levothyroxine on 10/15/12 with improvement in her fatigue  Her TSH was still relatively high in 11/14 along with low free T4. She was told to increase her dose to 100 mcg but she did not do so and because of lack of insurance she stopped the supplement about 2 months later  On her visit in 8/15 her thyroid levels were only marginally low without any supplementation; because of continued complaints of fatigue she was started on 25 mcg of levothyroxine. 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  She will also have difficulty sleeping at night because of hot flashes and sweating spells Her FSH was not higher when evaluated in 2014 She had been taking 0.3 mg of Premarin from her PCP She was changed to CombiPatch in 11/14 with relief of her hot flashes and is still using this.  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  Diabetes:  Previously had prediabetes but this had progressed and 2014. Her previous A1c was 7.1 % and in 8/15 this was 6.8 without any medications. She has started checking her blood sugar on her own with a One Touch Verio monitor which she did not bring She was restarted on metformin 500 mg twice a day. 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 Suggest ordering Direct LDL (Unit Code: [phone]).   Total Cholesterol/HDL Ratio:CHD Risk  Coronary Heart Disease Risk Table                                        Men       Women          1/2 Average Risk              3.4        3.3              Average Risk              5.0        4.4           2X Average Risk              9.6        7.1           3X Average Risk             23.4       11.0 Use the calculated Patient Ratio above and the CHD Risk table  to determine the patient's CHD Risk. ATP III Classification (LDL):       < 100        mg/dL         Optimal      100 - 129     mg/dL         Near or Above Optimal      130 - 159     mg/dL         Borderline High      160 - 189     mg/dL         High       > 190        mg/dL         Very High   09/14/2012   CREATININE  0.6 10/24/2013      Past Medical History  Diagnosis Date  . Anxiety   . Bipolar disorder   . Migraines   . Thyroid disease     Past Surgical History  Procedure Laterality Date  . Cholecystectomy    . Colonoscopy    . Corneal transplant      Family History  Problem Relation Age of Onset  . Autoimmune disease Mother   . Alcohol abuse Father   . Hyperlipidemia Father   . Hypertension Father   . Amenorrhea Brother   . Diabetes Maternal Grandmother   . Diabetes Maternal Grandfather    Her mother has rheumatoid arthritis  Social History:  reports that she has never smoked. She does not have any smokeless tobacco history on file. Her alcohol and drug histories are not on file.  Allergies:  Allergies  Allergen Reactions  . Tamiflu Anaphylaxis      Medication List       This list is accurate as of: 12/05/13  4:09 PM.  Always use your most recent med list.               atorvastatin 10 MG tablet  Commonly known as:  LIPITOR  Take 10 mg by mouth daily.     estradiol-norethindrone 0.05-0.14 MG/DAY  Commonly known as:  COMBIPATCH  Place 1 patch onto the skin 2 (two) times a week. *uses on wednesdays, and sundays*     levothyroxine 25 MCG tablet  Commonly known as:  SYNTHROID, LEVOTHROID  Take 25 mcg by mouth daily before breakfast.     levothyroxine 50 MCG tablet  Commonly known as:  SYNTHROID, LEVOTHROID  TAKE 1 TABLET (50 MCG TOTAL) BY MOUTH DAILY.     metFORMIN 500 MG tablet  Commonly known as:  GLUCOPHAGE  Take 500 mg by mouth daily with breakfast.     MULTIVITAMIN PO  Take 1 tablet by mouth daily.     ziprasidone 20 MG capsule  Commonly known as:  GEODON  Take 20 mg by mouth at bedtime.        Review of Systems:  She has hyperlipidemia and fatty liver related to metabolic syndrome. Her triglycerides have been over 500. Also her LDL baseline was significantly high at 189  Recently restarted on Lipitor  Lab Results  Component Value Date    CHOL 298* 01/10/2013   HDL 36.80* 01/10/2013   LDLCALC Comment:   Not calculated due to Triglyceride >400. Suggest ordering Direct LDL (Unit Code: [phone]).   Total Cholesterol/HDL Ratio:CHD Risk                        Coronary Heart Disease Risk Table                                        Men       Women          1/2 Average Risk              3.4        3.3              Average Risk              5.0        4.4           2X Average Risk              9.6        7.1           3X Average Risk             23.4       11.0 Use the calculated Patient Ratio above and the CHD Risk table  to determine the patient's CHD Risk. ATP III Classification (LDL):       < 100        mg/dL         Optimal      100 - 129     mg/dL         Near or Above Optimal      130 - 159     mg/dL         Borderline High      160 - 189     mg/dL         High       > 190        mg/dL         Very High   09/14/2012   LDLDIRECT 188.8 01/10/2013   TRIG 529.0* 01/10/2013   CHOLHDL 8 01/10/2013    She has a mild history of asthma She has history of high blood pressure  but currently is not on any medications           GASTROENTEROLOGY: She has known Celiac disease and tends to have loose stools.  She has a  history of atypical depression, is on treatment         Examination:    BP 131/96  Pulse 88  Temp(Src) 97.7 F (36.5 C)  Resp 16  Ht 5\' 3"  (1.6 m)  Wt 219 lb 3.2 oz (99.428 kg)  BMI 38.84 kg/m2  SpO2 98%  Exam not indicated  Assessments and Plan:   1. Hypothyroidism, long-standing possibly related to autoimmune thyroid disease; may have a component of secondary hypothyroidism because of low normal free T4  Although previously she had significantly increased TSH it has not been high recently With a small dose of 25 mcg she has not had much change in her fatigue which appears to be unrelated to endocrine causes  2. 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   3.  DIABETES: She has had  increase in glucose levels  with baseline A1c  7.1 Not clear if she has improved starting metformin Has not lost any weight Will have her try metformin ER 750 mg, 2 tablets daily Consider dietary counseling also She will have followup A1c in 3 months, to be drawn at her local hospital  4. She has hyperlipidemia and fatty liver related to metabolic syndrome.  She  is on Lipitor 10 mg daily and have fasting lipids checked  today     Tierra Thoma 12/05/2013, 4:09 PM

## 2013-12-06 LAB — COMPREHENSIVE METABOLIC PANEL
ALT: 84 U/L — ABNORMAL HIGH (ref 0–35)
AST: 60 U/L — ABNORMAL HIGH (ref 0–37)
Albumin: 4.1 g/dL (ref 3.5–5.2)
Alkaline Phosphatase: 131 U/L — ABNORMAL HIGH (ref 39–117)
BILIRUBIN TOTAL: 0.4 mg/dL (ref 0.2–1.2)
BUN: 4 mg/dL — ABNORMAL LOW (ref 6–23)
CO2: 25 mEq/L (ref 19–32)
CREATININE: 0.5 mg/dL (ref 0.4–1.2)
Calcium: 9.1 mg/dL (ref 8.4–10.5)
Chloride: 105 mEq/L (ref 96–112)
GFR: 145.31 mL/min (ref >60.00)
Glucose, Bld: 121 mg/dL — ABNORMAL HIGH (ref 70–99)
Potassium: 3.8 mEq/L (ref 3.5–5.1)
Sodium: 137 mEq/L (ref 135–145)
Total Protein: 7.6 g/dL (ref 6.0–8.3)

## 2013-12-06 LAB — LIPID PANEL
CHOL/HDL RATIO: 8
Cholesterol: 231 mg/dL — ABNORMAL HIGH (ref 0–200)
HDL: 30.5 mg/dL — AB (ref >39.00)
NonHDL: 200.5
Triglycerides: 415 mg/dL — ABNORMAL HIGH (ref 0.0–149.0)
VLDL: 83 mg/dL — AB (ref 0.0–40.0)

## 2013-12-06 LAB — T4, FREE: FREE T4: 0.67 ng/dL (ref 0.60–1.60)

## 2013-12-06 LAB — TSH: TSH: 2.32 u[IU]/mL (ref 0.35–4.50)

## 2013-12-06 NOTE — Patient Instructions (Signed)
Please check blood sugars at least half the time about 2 hours after any meal and as directed on waking up.   Please bring blood sugar monitor to each visit  

## 2013-12-07 LAB — LDL CHOLESTEROL, DIRECT: LDL DIRECT: 145.1 mg/dL

## 2013-12-13 ENCOUNTER — Other Ambulatory Visit: Payer: Self-pay | Admitting: *Deleted

## 2013-12-13 MED ORDER — FENOFIBRATE 145 MG PO TABS: 145.0000 mg | ORAL_TABLET | Freq: Every day | ORAL | Status: DC

## 2013-12-15 ENCOUNTER — Other Ambulatory Visit: Payer: Self-pay | Admitting: *Deleted

## 2013-12-15 MED ORDER — LEVOTHYROXINE SODIUM 75 MCG PO TABS: 75.0000 ug | ORAL_TABLET | Freq: Every day | ORAL | Status: DC

## 2014-03-06 ENCOUNTER — Ambulatory Visit: Payer: PRIVATE HEALTH INSURANCE | Admitting: Endocrinology

## 2014-03-06 ENCOUNTER — Encounter: Payer: Self-pay | Admitting: *Deleted

## 2014-03-06 ENCOUNTER — Telehealth: Payer: Self-pay | Admitting: Endocrinology

## 2014-03-06 NOTE — Telephone Encounter (Signed)
Patient no showed today's appt. Please advise on how to follow up. °A. No follow up necessary. °B. Follow up urgent. Contact patient immediately. °C. Follow up necessary. Contact patient and schedule visit in ___ days. °D. Follow up advised. Contact patient and schedule visit in ____weeks. ° °

## 2014-03-07 NOTE — Telephone Encounter (Signed)
Follow up advised. Contact patient and schedule visit

## 2014-03-15 ENCOUNTER — Telehealth: Payer: Self-pay | Admitting: *Deleted

## 2014-03-15 NOTE — Telephone Encounter (Signed)
Noted, left message on patients vm

## 2014-03-15 NOTE — Telephone Encounter (Signed)
Patient says she had a physical with you on 12/05/2013, I do not see where it's been done, but she's adamant it was done and needs a letter for her job stating you did this physical.  Please advise.   CB # [id number]

## 2014-03-15 NOTE — Telephone Encounter (Signed)
My notes say: Exam not indicated

## 2014-03-30 ENCOUNTER — Ambulatory Visit: Payer: PRIVATE HEALTH INSURANCE | Admitting: Endocrinology

## 2014-03-30 ENCOUNTER — Telehealth: Payer: Self-pay | Admitting: Endocrinology

## 2014-03-30 ENCOUNTER — Encounter: Payer: Self-pay | Admitting: *Deleted

## 2014-03-30 NOTE — Telephone Encounter (Signed)
Patient no showed today's appt. Please advise on how to follow up. °A. No follow up necessary. °B. Follow up urgent. Contact patient immediately. °C. Follow up necessary. Contact patient and schedule visit in ___ days. °D. Follow up advised. Contact patient and schedule visit in ____weeks. ° °

## 2014-03-30 NOTE — Telephone Encounter (Signed)
This is patients 3rd no show.

## 2014-04-03 ENCOUNTER — Telehealth: Payer: Self-pay | Admitting: Family Medicine

## 2014-04-03 NOTE — Telephone Encounter (Signed)
Please let her know that if she is not going to show up for another appointment we will have to dismiss her, needs to be seen within the next month

## 2014-04-03 NOTE — Telephone Encounter (Signed)
Paged to [facility] around 8:15pm by patient, Holly Richards, who reported that she has been having worsening intermittent abdominal cramping for about 24 hours, and today with start of water diarrhea (non-bloody), stating it "doesn't smell like C.Diff" (per chart review C.Diff negative 2012). Reports her PMH Celiac Disease, h/o Turner's Syndrome (menopause at 76 yrs). States that the abdominal cramping is similar to previous, but she isn't positive is it is like previous Celiac flare. Describes generalized cramping, not constant and not radiating, nothing seems to make it worse, improved with BM/diarrhea. Admits to nausea (without vomiting), tolerating PO well with increased fluids today. Denies any fever/chills, vomiting, constant abd pain, blood in stool. Checked her own vitals at home, reportedly "normal". Denies any new medications or dietary changes.  I advised her that her symptoms could be due to Celiac, otherwise possible for viral gastroenteritis, food poisoning, unlikely C.Diff (however note on Cipro 01/2014 per chart review). Reassurance provided and advised patient to continue monitor symptoms, cont improved PO hydration, advised follow-up in clinic tomorrow pending course, and red flags given for fevers, worsening constant pain, vomiting, or other significant worsening would warrant evaluation in ED. Patient understood, agrees with plan.  Nobie Putnam, [place], PGY-2

## 2014-04-04 NOTE — Telephone Encounter (Signed)
Patient is calling for the results of her lab work °

## 2014-04-05 NOTE — Telephone Encounter (Signed)
Left a message for pt to return call 

## 2014-04-06 ENCOUNTER — Ambulatory Visit: Payer: PRIVATE HEALTH INSURANCE | Admitting: Endocrinology

## 2014-04-11 ENCOUNTER — Encounter: Payer: Self-pay | Admitting: Endocrinology

## 2014-04-12 ENCOUNTER — Encounter: Payer: Self-pay | Admitting: Endocrinology

## 2014-04-12 ENCOUNTER — Other Ambulatory Visit: Payer: Self-pay | Admitting: *Deleted

## 2014-04-12 ENCOUNTER — Telehealth: Payer: Self-pay | Admitting: Endocrinology

## 2014-04-12 ENCOUNTER — Ambulatory Visit (INDEPENDENT_AMBULATORY_CARE_PROVIDER_SITE_OTHER): Payer: PRIVATE HEALTH INSURANCE | Admitting: Endocrinology

## 2014-04-12 VITALS — BP 138/92 | HR 84 | Temp 97.8°F | Resp 16 | Ht 63.0 in | Wt 218.4 lb

## 2014-04-12 DIAGNOSIS — K760 Fatty (change of) liver, not elsewhere classified: Secondary | ICD-10-CM | POA: Diagnosis not present

## 2014-04-12 DIAGNOSIS — E230 Hypopituitarism: Secondary | ICD-10-CM

## 2014-04-12 DIAGNOSIS — E119 Type 2 diabetes mellitus without complications: Secondary | ICD-10-CM

## 2014-04-12 DIAGNOSIS — E039 Hypothyroidism, unspecified: Secondary | ICD-10-CM | POA: Diagnosis not present

## 2014-04-12 LAB — GLUCOSE, POCT (MANUAL RESULT ENTRY): POC GLUCOSE: 139 mg/dL — AB (ref 70–99)

## 2014-04-12 MED ORDER — FENOFIBRATE 145 MG PO TABS: 145.0000 mg | ORAL_TABLET | Freq: Every day | ORAL | Status: DC

## 2014-04-12 MED ORDER — ATORVASTATIN CALCIUM 10 MG PO TABS: 10.0000 mg | ORAL_TABLET | Freq: Every day | ORAL | Status: DC

## 2014-04-12 MED ORDER — ESTRADIOL-NORETHINDRONE ACET 0.05-0.14 MG/DAY TD PTTW: 1.0000 | MEDICATED_PATCH | TRANSDERMAL | Status: DC

## 2014-04-12 MED ORDER — METFORMIN HCL ER 750 MG PO TB24: 1500.0000 mg | ORAL_TABLET | Freq: Every day | ORAL | Status: DC

## 2014-04-12 MED ORDER — LEVOTHYROXINE SODIUM 75 MCG PO TABS: 75.0000 ug | ORAL_TABLET | Freq: Every day | ORAL | Status: DC

## 2014-04-12 NOTE — Progress Notes (Signed)
Patient ID: Holly Richards, female   DOB: 08-Jul-1980, 34 y.o.   MRN: [id number]   Reason for Appointment: Multiple endocrine problems   History of Present Illness:   Problem 1:  Hyothyroidism was first diagnosed in 2009    The symptoms consistent with hypothyroidism  at the time of diagnosis were: fatigue 5 yrs, feeling sleepy but no other typical symptoms. Also has had difficulty losing weight She previously has had persistently high TSH level since at least 2009 without any treatment She was started on 50 mcg of levothyroxine on 10/15/12 with improvement in her fatigue  Her TSH was still relatively high in 11/14 along with low free T4. She had been irregular with her medications subsequently  On her visit in 8/15 her thyroid levels were only marginally low without any supplementation; because of continued complaints of fatigue she was started on 25 mcg of levothyroxine.  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  She will also have difficulty sleeping at night because of hot flashes and sweating spells Her FSH was not higher when evaluated in 2014 She had been taking 0.3 mg of Premarin from her PCP She was changed to CombiPatch in 11/14 with relief of her hot flashes and is still using this.  She was also treated with GROWTH HORMONE in childhood because of her Turner syndrome and growth  hormone deficiency Has not had any growth hormone deficiency subsequently as judged by her normal IGF 1 level in 8/15  3.  Diabetes:  Previously had prediabetes but this had progressed in 2014 to overt diabetes.  Her previous A1c was 7.1 % and in 8/15 this was 6.8 without any medications. She was checking her blood sugar on her own but now only at work: She thinks the blood sugar is about 180 either fasting or nonfasting. Also lab fasting glucose was 189 in December 2015 She has a [employer] which she did not bring She was started on metformin 500 mg twice a day and this was increased to 750 mg twice a day on her last visit but she did not get the prescription because of misunderstanding.  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 However has done some aerobic activity recently She has difficulty losing weight    Wt Readings from Last 3 Encounters:  04/12/14 218 lb 6.4 oz (99.066 kg)  12/05/13 219 lb 3.2 oz (99.428 kg)  10/24/13 219 lb (99.338 kg)   A1c in 12/15 was 6.1, outside lab  Lab Results  Component Value Date   HGBA1C 6.8* 10/24/2013   HGBA1C 7.1* 01/20/2013   HGBA1C 6.5* 11/06/2010   Lab Results  Component Value Date   [facility]  09/14/2012     Comment:  Not calculated due to Triglyceride >400. Suggest ordering Direct LDL (Unit Code: [phone]).   Total Cholesterol/HDL Ratio:CHD Risk                        Coronary Heart Disease Risk Table                                        Men       Women          1/2 Average Risk              3.4        3.3              Average Risk              5.0        4.4           2X Average Risk              9.6        7.1           3X Average Risk             23.4       11.0 Use the calculated Patient Ratio above and the CHD Risk table  to determine the patient's CHD Risk. ATP III Classification (LDL):       < 100        mg/dL         Optimal       100 - 129     mg/dL         Near or Above Optimal      130 - 159     mg/dL         Borderline High      160 - 189     mg/dL         High       > 190        mg/dL         Very High     CREATININE 0.5 12/05/2013      Past Medical History  Diagnosis Date  . Anxiety   . Bipolar disorder   . Migraines   . Thyroid disease     Past Surgical History  Procedure Laterality Date  . Cholecystectomy    . Colonoscopy    . Corneal transplant      Family History  Problem Relation Age of Onset  . Autoimmune disease Mother   . Alcohol abuse Father   . Hyperlipidemia Father   . Hypertension Father   . Amenorrhea Brother   . Diabetes Maternal Grandmother   . Diabetes Maternal Grandfather    Her mother has rheumatoid arthritis  Social History:  reports that she has never smoked. She does not have any smokeless tobacco history on file. Her alcohol and drug histories are not on file.  Allergies:  Allergies  Allergen Reactions  . Tamiflu Anaphylaxis      Medication List       This list is accurate as of: 04/12/14 11:05 AM.  Always use your most recent med list.               atorvastatin 10 MG tablet  Commonly known as:  LIPITOR  Take 10 mg by mouth daily.  estradiol-norethindrone 0.05-0.14 MG/DAY  Commonly known as:  COMBIPATCH  Place 1 patch onto the skin 2 (two) times a week. *uses on wednesdays, and "sundays*     fenofibrate 145 MG tablet  Commonly known as:  TRICOR  Take 1 tablet (145 mg total) by mouth daily.     levothyroxine 75 MCG tablet  Commonly known as:  SYNTHROID, LEVOTHROID  Take 1 tablet (75 mcg total) by mouth daily.     metFORMIN 750 MG 24 hr tablet  Commonly known as:  GLUCOPHAGE-XR  Take 2 tablets (1,500 mg total) by mouth daily with breakfast.     MULTIVITAMIN PO  Take 1 tablet by mouth daily.     ziprasidone 20 MG capsule  Commonly known as:  GEODON  Take 20 mg by mouth at bedtime.        Review of Systems:  She has hyperlipidemia  and fatty liver related to metabolic syndrome. Her triglycerides have been over 500. Also her LDL baseline was significantly high at 189  Recently taking Lipitor but has not taken her fenofibrate, previous LDL was high  Lab Results  Component Value Date   CHOL 231* 12/05/2013   HDL 30.50* 12/05/2013   LDLCALC  09/14/2012     Comment:       Not calculated due to Triglyceride >400. Suggest ordering Direct LDL (Unit Code: 81033).   Total Cholesterol/HDL Ratio:CHD Risk                        Coronary Heart Disease Risk Table                                        Men       Women          1/2 Average Risk              3.4        3.3              Average Risk              5.0        4.4           2X Average Risk              9.6        7.1           3X Average Risk             23.4       11.0 Use the calculated Patient Ratio above and the CHD Risk table  to determine the patient's CHD Risk. ATP III Classification (LDL):       < 100        mg/dL         Optimal      100 - 129     mg/dL         Near or Above Optimal      130 - 159     mg/dL         Borderline High      16" 0 - 189     mg/dL         High       > 190        mg/dL         Very High  LDLDIRECT 145.1 12/05/2013   TRIG * 12/05/2013    415.0 Triglyceride is over 400; calculations on Lipids are invalid.   CHOLHDL 8 12/05/2013    She has history of high blood pressure  but currently is not on any medications, followed by PCP            GASTROENTEROLOGY: She has known Celiac disease and tends to have loose stools.     She has a  history of atypical depression, is on treatment         Examination:    BP 138/92 mmHg  Pulse 84  Temp(Src) 97.8 F (36.6 C)  Resp 16  Ht 5\' 3"  (1.6 m)  Wt 218 lb 6.4 oz (99.066 kg)  BMI 38.70 kg/m2  SpO2 99%  Biceps reflexes appear normal No pedal edema  Assessments and Plan:   1. Hypothyroidism, long-standing possibly related to autoimmune thyroid disease; probably has a component  of secondary hypothyroidism because of low or low normal free T4 on her previous testing  With her dose of 75 g she has had improvement in her fatigue Not clear why her free T4 was still slightly low even though her TSH was fairly normal  Since she is symptomatically better with 75 g we'll continue the same dose until her next visit  2. 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   3.  DIABETES: She has had  increase in glucose levels with baseline A1c  7.1 Although her blood sugars appear to be still around 180 her A1c from outside lab was only 6.1 Most likely the A1c is not accurate Discussed that she needs to be on metformin and explained to the patient that she does have overt diabetes  She does need to do more aerobic exercise and try to lose weight She will ask her PCP to arrange for a consultation with dietitian at the local hospital For now will restart her metformin ER 1500 daily This should also help her fatty liver but emphasized the need for weight loss  She will have followup A1c in 3 months, to be drawn at her local hospital  4. She has hyperlipidemia and fatty liver related to metabolic syndrome.  She is on Lipitor 10 mg daily and have repeat lipids.  She does need to restart her fenofibrate, 90 day prescription sent  5.  Probable hypertension: She needs to follow-up with PCP for initiation of treatment  6.  Abnormal liver function tests from a fatty liver    Holly Richards 04/12/2014, 11:05 AM

## 2014-04-12 NOTE — Telephone Encounter (Signed)
Per Dr. Dwyane Dee she is suppose to be taking Atorvastatin and Fenofibrate

## 2014-04-12 NOTE — Patient Instructions (Signed)
Please check blood sugars at least half the time about 2 hours after any meal and 3 times per week on waking up.  Please bring blood sugar monitor to each visit. Recommended blood sugar levels about 2 hours after meal is 140-180 and on waking up 90-130  Start Metformin 750mg , 2 daily with food  Exercise 3/7 daily

## 2014-04-12 NOTE — Telephone Encounter (Signed)
Pharmacy calling due to confusion regarding the torvastatin. Is she or is she not supposed to take it. Call back [phone]

## 2014-05-02 NOTE — Telephone Encounter (Signed)
error 
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
## 2014-07-12 ENCOUNTER — Ambulatory Visit: Payer: PRIVATE HEALTH INSURANCE | Admitting: Endocrinology

## 2014-07-12 DIAGNOSIS — Z0289 Encounter for other administrative examinations: Secondary | ICD-10-CM

## 2014-07-13 ENCOUNTER — Telehealth: Payer: Self-pay | Admitting: Endocrinology

## 2014-07-13 NOTE — Telephone Encounter (Signed)
This is patients 3rd no show in a row

## 2014-07-13 NOTE — Telephone Encounter (Signed)
Patient no showed today's appt. Please advise on how to follow up. °A. No follow up necessary. °B. Follow up urgent. Contact patient immediately. °C. Follow up necessary. Contact patient and schedule visit in ___ days. °D. Follow up advised. Contact patient and schedule visit in ____weeks. ° °

## 2014-08-22 ENCOUNTER — Encounter: Payer: Self-pay | Admitting: *Deleted

## 2014-08-22 NOTE — Telephone Encounter (Signed)
On your desk for signature

## 2014-08-22 NOTE — Telephone Encounter (Signed)
Please do a letter for dismissal for multiple no shows

## 2014-09-01 ENCOUNTER — Telehealth: Payer: Self-pay | Admitting: Endocrinology

## 2014-09-01 NOTE — Telephone Encounter (Signed)
Patient dismissed from [facility] by Elayne Snare, M.D. effective August 22, 2014. Dismissal letter sent out by certified / registered mail on September 04, 2014 [facility]

## 2014-09-06 ENCOUNTER — Other Ambulatory Visit: Payer: Self-pay | Admitting: Endocrinology

## 2014-09-07 ENCOUNTER — Telehealth: Payer: Self-pay

## 2014-09-07 NOTE — Telephone Encounter (Signed)
Pt called about why she was dismissed and I explained to her it was for the no shows and also her cancelling and not rescheduling appts. I did deny the pt for a final 90 day supply of all meds due to her last visit being 6 months ago.

## 2014-09-08 NOTE — Telephone Encounter (Signed)
Received signed domestic return receipt on September 08, 2014 verifying delivery of certified letter on September 06, 2014. Article number [id number]. [place]

## 2015-03-06 ENCOUNTER — Telehealth: Payer: Self-pay | Admitting: Family Medicine

## 2015-03-06 NOTE — Telephone Encounter (Signed)
Emergency Line / After Hours Call  Having a right sided headache. Has been occurring for past 3-4 days.  Has not headaches before this severe. No changes in vision or dizziness. Having some nauseous. BP at [employer] 179/98. She has a history of Turner syndrome, hypothyroidism, and diabetes. She has not been seen since January for these problems and not seen in our clinic since 2014. I have given her indications as to why she would need to be seen immediately in the emergency department and she understood otherwise informed that she should be seen in clinic within the next few days for follow-up of these issues.  Rosemarie Ax, MD PGY-3, [facility] 03/06/2015, 7:33 PM

## 2015-03-08 ENCOUNTER — Encounter: Payer: Self-pay | Admitting: Family Medicine

## 2015-03-08 ENCOUNTER — Ambulatory Visit (INDEPENDENT_AMBULATORY_CARE_PROVIDER_SITE_OTHER): Payer: No Typology Code available for payment source | Admitting: Family Medicine

## 2015-03-08 VITALS — BP 144/83 | HR 77 | Temp 98.0°F | Ht 63.0 in | Wt 209.6 lb

## 2015-03-08 DIAGNOSIS — R519 Headache, unspecified: Secondary | ICD-10-CM

## 2015-03-08 DIAGNOSIS — R51 Headache: Secondary | ICD-10-CM | POA: Diagnosis not present

## 2015-03-08 MED ORDER — KETOROLAC TROMETHAMINE 30 MG/ML IJ SOLN: 30.0000 mg | Freq: Once | INTRAMUSCULAR | Status: DC

## 2015-03-08 MED ORDER — KETOROLAC TROMETHAMINE 30 MG/ML IJ SOLN
30.0000 mg | Freq: Once | INTRAMUSCULAR | Status: AC
  Administered 2015-03-08: 30 mg via INTRAMUSCULAR

## 2015-03-08 NOTE — Patient Instructions (Signed)
From your vision test I think you need to go back and see your eye doctor as soon as possible.  SEEK MEDICAL CARE IF:   Your symptoms are not helped by medicine.  You have a headache that is different from the usual headache.  You have nausea or you vomit.  You have a fever. SEEK IMMEDIATE MEDICAL CARE IF:   Your headache becomes severe.  You have repeated vomiting.  You have a stiff neck.  You have a loss of vision.  You have problems with speech.  You have pain in the eye or ear.  You have muscular weakness or loss of muscle control.  You lose your balance or have trouble walking.  You feel faint or pass out.  You have confusion.   This information is not intended to replace advice given to you by your health care provider. Make sure you discuss any questions you have with your health care provider.   Document Released: 03/03/2005 Document Revised: 11/22/2014 Document Reviewed: 06/26/2014 Elsevier Interactive Patient Education [employer].

## 2015-03-08 NOTE — Progress Notes (Signed)
Patient ID: Holly Richards, female   DOB: 1980-10-08, 34 y.o.   MRN: [id number]    Subjective: CC: headache  HPI: Patient is a 34 y.o. female with a past medical history of Turner syndrome and cataracts  presenting to clinic today for concerns of headache.  HEADACHE  Headache started 4 days ago Pain is  Throbbing in nature Severity:  Severe  Location: Right parietal, but sometimes feels pressure everywhere. Medications tried: Tension headache every 4 hours on Tuesday and Excedrin migraine once without improvement.  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   Patient thinks cause of headache might be: due to elevated BPs. 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   Social History: non-smoker   ROS: All other systems reviewed and are negative.  Past Medical History Patient Active Problem List   Diagnosis Date Noted  . Headache 03/09/2015  . Preventive measure 02/09/2013  . Elevated blood-pressure reading without diagnosis of hypertension 02/09/2013  . Abdominal pain, right lower quadrant 10/22/2012  . Other and unspecified hyperlipidemia 10/18/2012  . Unspecified hypothyroidism 10/15/2012  . Prediabetes 10/15/2012  . Lumbar strain 05/06/2012  . Diaphoresis / heat intolerance 01/15/2012  . Bipolar disorder ([place])   . Anxiety   . 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 Obesity 03/05/2011  . Thyroid activity decreased 12/30/2010  . Celiac disease/sprue 11/21/2010    Medications- reviewed and  updated Current Outpatient Prescriptions  Medication Sig Dispense Refill  . atorvastatin (LIPITOR) 10 MG tablet Take 1 tablet (10 mg total) by mouth daily. 90 tablet 1  . estradiol-norethindrone (COMBIPATCH) 0.05-0.14 MG/DAY Place 1 patch onto the skin 2 (two) times a week. *uses on wednesdays, and sundays* 24 patch 1  . fenofibrate (TRICOR) 145 MG tablet TAKE 1 TABLET BY MOUTH DAILY. 30 tablet 0  . levothyroxine (SYNTHROID, LEVOTHROID) 75 MCG tablet Take 1 tablet (75 mcg total) by mouth daily. 90 tablet 1  . metFORMIN (GLUCOPHAGE-XR) 750 MG 24 hr tablet Take 2 tablets (1,500 mg total) by mouth daily with breakfast. 180 tablet 1  . Multiple Vitamins-Minerals (MULTIVITAMIN PO) Take 1 tablet by mouth daily.    . ziprasidone (GEODON) 20 MG capsule Take 20 mg by mouth at bedtime.     No current facility-administered medications for this visit.    Objective: Office vital signs reviewed. BP 144/83 mmHg  Pulse 77  Temp(Src) 98 F (36.7 C) (Oral)  Ht 5\' 3"  (1.6 m)  Wt 209 lb 9.6 oz (95.074 kg)  BMI 37.14 kg/m2  LMP    Physical Examination:  General: Awake, alert, well- nourished, NAD ENMT:  TMs intact, normal light reflex, no erythema, no bulging. Nasal turbinates moist. MMM, Oropharynx clear without erythema or tonsillar exudate/hypertrophy. Patient endorses significant tenderness and pain with palpation over the right parietal region, however no rashes or lesions noted.  Eyes: Conjunctiva non-injected. PERRL. Difficult fundasoopic exam, however do not detect any papilledema.  Cardio: RRR, no m/r/g noted.  Pulm: No increased WOB.  CTAB, without wheezes,  rhonchi or crackles noted.  Neuro: A&O x4. Speech clear. EOMI, Uvula and tongue midline. Facial movements symmetric. 5/5 strength in the upper extremities and lower extremities bilaterally. Sensation intact bilaterally. Normal DTRs.     Visual Acuity Screening   Right eye Left eye Both eyes  Without correction:     With correction: 20/40  20/120 20/40     Assessment/Plan: Headache Patient with an odd presentation- unilateral location would suggest migraine however she has no other symptoms to suggest migraine. I cannot explain her exquisite tenderness on exam, as I see no rash or lesions. No evidence of increased intracranial pressure. Her BP is not worrisome here. Her vision is terrible- 20/120 in the left eye, the patient states it used to be 20/400. I recommended this could be partially to blame. Renal function normal, provided toradol 30mg  IM in the clinic. Discussed f/u with opthalmology as it has been almost a year since she's seen him. Discussed analgesic rebound. Discussed reasons to return to clinic- worsening, change in character, neurologic deficits. Patient voiced understanding. Examined and discussed with attending, Dr. Ree Kida    No orders of the defined types were placed in this encounter.    Meds ordered this encounter  Medications  . DISCONTD: ketorolac (TORADOL) 30 MG/ML injection 30 mg    Sig:   . ketorolac (TORADOL) 30 MG/ML injection 30 mg    Sig:     Archie Patten PGY-2, [place] [facility]

## 2015-03-09 DIAGNOSIS — R519 Headache, unspecified: Secondary | ICD-10-CM | POA: Insufficient documentation

## 2015-03-09 DIAGNOSIS — R51 Headache: Secondary | ICD-10-CM

## 2015-03-09 NOTE — Assessment & Plan Note (Addendum)
Patient with an odd presentation- unilateral location would suggest migraine however she has no other symptoms to suggest migraine. I cannot explain her exquisite tenderness on exam, as I see no rash or lesions. No evidence of increased intracranial pressure. Her BP is not worrisome here. Her vision is terrible- 20/120 in the left eye, the patient states it used to be 20/400. I recommended this could be partially to blame. Renal function normal, provided toradol 30mg  IM in the clinic. Discussed f/u with opthalmology as it has been almost a year since she's seen him. Discussed analgesic rebound. Discussed reasons to return to clinic- worsening, change in character, neurologic deficits. Patient voiced understanding. Examined and discussed with attending, Dr. Ree Kida

## 2015-09-12 ENCOUNTER — Encounter: Payer: Self-pay | Admitting: Family Medicine

## 2015-09-12 ENCOUNTER — Ambulatory Visit (HOSPITAL_COMMUNITY)
Admission: RE | Admit: 2015-09-12 | Discharge: 2015-09-12 | Disposition: A | Discharge status: Home / Self Care | Payer: No Typology Code available for payment source | Source: Ambulatory Visit | Attending: Family Medicine | Admitting: Family Medicine

## 2015-09-12 ENCOUNTER — Telehealth: Payer: Self-pay | Admitting: Family Medicine

## 2015-09-12 ENCOUNTER — Ambulatory Visit (INDEPENDENT_AMBULATORY_CARE_PROVIDER_SITE_OTHER): Payer: No Typology Code available for payment source | Admitting: Family Medicine

## 2015-09-12 ENCOUNTER — Other Ambulatory Visit: Payer: Self-pay | Admitting: Family Medicine

## 2015-09-12 VITALS — BP 148/98 | HR 88 | Temp 98.0°F | Wt 220.0 lb

## 2015-09-12 DIAGNOSIS — E039 Hypothyroidism, unspecified: Secondary | ICD-10-CM

## 2015-09-12 DIAGNOSIS — R9431 Abnormal electrocardiogram [ECG] [EKG]: Secondary | ICD-10-CM | POA: Diagnosis not present

## 2015-09-12 DIAGNOSIS — R5383 Other fatigue: Secondary | ICD-10-CM | POA: Diagnosis not present

## 2015-09-12 DIAGNOSIS — Z20828 Contact with and (suspected) exposure to other viral communicable diseases: Secondary | ICD-10-CM

## 2015-09-12 DIAGNOSIS — I4581 Long QT syndrome: Secondary | ICD-10-CM | POA: Insufficient documentation

## 2015-09-12 DIAGNOSIS — I10 Essential (primary) hypertension: Secondary | ICD-10-CM

## 2015-09-12 DIAGNOSIS — E119 Type 2 diabetes mellitus without complications: Secondary | ICD-10-CM | POA: Insufficient documentation

## 2015-09-12 LAB — LIPID PANEL
CHOL/HDL RATIO: 9.3 ratio — AB (ref <=5.0)
CHOLESTEROL: 289 mg/dL — AB (ref 125–200)
HDL: 31 mg/dL — AB (ref >=46)
TRIGLYCERIDES: 444 mg/dL — AB (ref <150)

## 2015-09-12 LAB — POCT URINALYSIS DIPSTICK
BILIRUBIN UA: NEGATIVE
Blood, UA: NEGATIVE
Glucose, UA: NEGATIVE
KETONES UA: NEGATIVE
LEUKOCYTES UA: NEGATIVE
Nitrite, UA: NEGATIVE
PROTEIN UA: 30
SPEC GRAV UA: 1.02
Urobilinogen, UA: 1
pH, UA: 7

## 2015-09-12 LAB — COMPLETE METABOLIC PANEL WITH GFR
ALBUMIN: 4.6 g/dL (ref 3.6–5.1)
ALK PHOS: 136 U/L — AB (ref 33–115)
ALT: 113 U/L — ABNORMAL HIGH (ref 6–29)
AST: 82 U/L — ABNORMAL HIGH (ref 10–30)
BILIRUBIN TOTAL: 0.4 mg/dL (ref 0.2–1.2)
BUN: 5 mg/dL — ABNORMAL LOW (ref 7–25)
CALCIUM: 9.4 mg/dL (ref 8.6–10.2)
CO2: 25 mmol/L (ref 20–31)
Chloride: 99 mmol/L (ref 98–110)
Creat: 0.54 mg/dL (ref 0.50–1.10)
GLUCOSE: 172 mg/dL — AB (ref 65–99)
Potassium: 4.1 mmol/L (ref 3.5–5.3)
SODIUM: 139 mmol/L (ref 135–146)
TOTAL PROTEIN: 7 g/dL (ref 6.1–8.1)

## 2015-09-12 LAB — POCT UA - MICROSCOPIC ONLY

## 2015-09-12 LAB — POCT GLYCOSYLATED HEMOGLOBIN (HGB A1C): Hemoglobin A1C: 8.2

## 2015-09-12 LAB — CBC
HCT: 42.1 % (ref 35.0–45.0)
Hemoglobin: 14.2 g/dL (ref 11.7–15.5)
MCH: 29 pg (ref 27.0–33.0)
MCHC: 33.7 g/dL (ref 32.0–36.0)
MCV: 86.1 fL (ref 80.0–100.0)
MPV: 10.1 fL (ref 7.5–12.5)
Platelets: 287 10*3/uL (ref 140–400)
RBC: 4.89 MIL/uL (ref 3.80–5.10)
RDW: 13.3 % (ref 11.0–15.0)
WBC: 6.3 10*3/uL (ref 3.8–10.8)

## 2015-09-12 LAB — T4, FREE: FREE T4: 1.1 ng/dL (ref 0.8–1.8)

## 2015-09-12 LAB — TSH: TSH: 5.93 m[IU]/L — AB

## 2015-09-12 MED ORDER — ATORVASTATIN CALCIUM 10 MG PO TABS: 10.0000 mg | ORAL_TABLET | Freq: Every day | ORAL | Status: DC

## 2015-09-12 MED ORDER — METFORMIN HCL ER 500 MG PO TB24: 500.0000 mg | ORAL_TABLET | Freq: Every day | ORAL | Status: DC

## 2015-09-12 MED ORDER — LISINOPRIL 10 MG PO TABS: 5.0000 mg | ORAL_TABLET | Freq: Every day | ORAL | Status: DC

## 2015-09-12 NOTE — Assessment & Plan Note (Signed)
Multiple hypertensive values over a broken. Of follow-up visits. - Initiating antihypertensive medication with lisinopril 10 mg daily. - Strong consideration for follow-up BMP to assess potassium levels at follow-up visit.

## 2015-09-12 NOTE — Telephone Encounter (Signed)
No. Patient is just getting started and metformin doesn't come w/ the risk of hypoglycemia. Patient's new PCP will likely get her started w/ one in the near future.

## 2015-09-12 NOTE — Telephone Encounter (Signed)
Pt called because when she picked up her prescriptions they asked her about having a glucometer. Does she need one. Please call and let her know. j w

## 2015-09-12 NOTE — Assessment & Plan Note (Signed)
Obtaining TSH/T4/T3 values today as patient artery has diagnosis of thyroid dysfunction. Patient is not currently taking any medication for this. - Holding off on Synthroid therapy at this time as I would like to know exactly what patient's thyroid hormone status is. - Strong consideration for initiating Synthroid therapy at follow-up visit pending lab results.

## 2015-09-12 NOTE — Patient Instructions (Addendum)
Fatigue Evaluation - you should: - Begin taking the medications I prescribed for you today. - Your new medications include metformin (once daily), lisinopril (once daily), and atorvastatin (once daily) - Please make a follow-up appointment with your new PCP a week from today. At that time you and your provider will go through the lab results from today's visit. Call us or go to the ER if you have severe chest pain or shortness of breath or fever or feel much worse Come back to see [place] in 1 week.

## 2015-09-12 NOTE — Assessment & Plan Note (Addendum)
Currently of unknown etiology. Patient has a complicated medical history with diagnosed but untreated thyroid disease, prediabetes/diabetes, high blood pressure. PHQ9 provided in office today yielded a score of 10 (moderate depressive symptoms), GAD7 provided as well yielded a score of 13 (moderate anxiety symptoms). Will test for hormone/blood/electrolyte abnormalities as a source of patient's fatigue, but at this time cannot rule out psychological sources. Of note there was some presence of dysphasia/discomfort with palpation of the thyroid gland. No evidence of pain but this does make one concerned for hypertrophy of the thyroid gland causing a globus type scenario. - Obtaining labs today. - Obtaining EKG due to a porous history of left-sided chest pain -- EKG reassuring - Follow-up next week to go over labs and to further management for patient's untreated conditions.  SIDE NOTE: Patient has NOT been compliant with any medications at this time. She is not currently taking any medications. Patient has also been fired by her endocrinologist. I deleted these meds from her list b/c she is currently not taking these. These medications may be important to restart once she has reestablished with her new PCP. These medications include: - Fenofibrate 145 mg - Synthroid 75 g - Estradiol-norethindrone patch 0.05-0.14 mg (2 times weekly) - Geodon 20 mg @ bedtime

## 2015-09-12 NOTE — Progress Notes (Signed)
FATIGUE Been progressive. Over the past week. Extremely tired regardless of sleep. Feels exhausted regardless of activity. Everything from work, or going to the grocery store, to putting on her bra.  Not taking ANY medications at this time. Has not taken ANY medication for ANY of her issues.   Patient complains of fatigue for the last 7-10 days. The Tiredness is constant and exhausting. It is best described as exhaustion.   Symptoms Fever: no Sweating at night: yes, but this has been going on for ~52yrs Weight Loss: no, up ~11lbs since December Shortness of Breath: only when "really exerting myself" Coughing up Blood: no Muscle Pain or Weakness: yes -- wrists Black or bloody Stools: no Severe Snoring or Daytime Sleepiness: no Feeling Down: denied by patient. Not enjoying things: some Rash: no Leg or Joint Swelling: yes, mostly in feet. Chest Pain or irregular heart beat:  Some left sided inframammary pain -- is worse w/ exertion.   ROS - Please see HPI Smoking Status Noted  Objective: BP 148/98 mmHg  Pulse 88  Temp(Src) 98 F (36.7 C) (Oral)  Wt 220 lb (99.791 kg)  SpO2 96% Gen: NAD, alert, cooperative, appears nervous. HEENT: NCAT, EOMI, PERRL, neck is thick and dorsocervical fat pad hypertrophy present. No palpable thyromegaly present but patient experienced significant feelings of discomfort and dysphagia with light pressure to this region. No LAD. Neck FROM. TMs clear. OP clear. CV: RRR, no murmur Resp: CTAB, no wheezes, non-labored Abd: SNTND, obese. Ext: No edema, warm, muscle tone slightly increased making joints appear stiff but with full range of motion, DTRs +0 throughout. Neuro: Alert and oriented, Speech clear, No gross deficits.  Assessment and plan:  Other fatigue Currently of unknown etiology. Patient has a complicated medical history with diagnosed but untreated thyroid disease, prediabetes/diabetes, high blood pressure. PHQ9 provided in office today  yielded a score of 10 (moderate depressive symptoms), GAD7 provided as well yielded a score of 13 (moderate anxiety symptoms). Will test for hormone/blood/electrolyte abnormalities as a source of patient's fatigue, but at this time cannot rule out psychological sources. Of note there was some presence of dysphasia/discomfort with palpation of the thyroid gland. No evidence of pain but this does make one concerned for hypertrophy of the thyroid gland causing a globus type scenario. - Obtaining labs today. - Obtaining EKG due to a porous history of left-sided chest pain -- EKG reassuring - Follow-up next week to go over labs and to further management for patient's untreated conditions.  SIDE NOTE: Patient has NOT been compliant with any medications at this time. She is not currently taking any medications. Patient has also been fired by her endocrinologist. I deleted these meds from her list b/c she is currently not taking these. These medications may be important to restart once she has reestablished with her new PCP. These medications include: - Fenofibrate 145 mg - Synthroid 75 g - Estradiol-norethindrone patch 0.05-0.14 mg (2 times weekly) - Geodon 20 mg @ bedtime  Type 2 diabetes mellitus without complication, without long-term current use of insulin (HCC) Untreated: Obtaining A1c today. Initiating metformin 500 mg daily; would strongly consider increasing dosage at follow-up visit in one week.  Thyroid activity decreased Obtaining TSH/T4/T3 values today as patient artery has diagnosis of thyroid dysfunction. Patient is not currently taking any medication for this. - Holding off on Synthroid therapy at this time as I would like to know exactly what patient's thyroid hormone status is. - Strong consideration for initiating Synthroid therapy at follow-up  visit pending lab results.  Essential hypertension, benign Multiple hypertensive values over a broken. Of follow-up visits. - Initiating  antihypertensive medication with lisinopril 10 mg daily. - Strong consideration for follow-up BMP to assess potassium levels at follow-up visit.    Orders Placed This Encounter  Procedures  . VITAMIN D 25 Hydroxy (Vit-D Deficiency, Fractures)  . HIV antibody  . CBC  . TSH  . Lipid panel  . RPR  . COMPLETE METABOLIC PANEL WITH GFR  . T4, free  . T3  . Urinalysis Dipstick  . POCT UA - Microscopic Only  . POCT glycosylated hemoglobin (Hb A1C)  . EKG 12-Lead    Meds ordered this encounter  Medications  . dicyclomine (BENTYL) 20 MG tablet    Sig: Take 20 mg by mouth every 6 (six) hours as needed.    Refill:  0  . lisinopril (PRINIVIL,ZESTRIL) 10 MG tablet    Sig: Take 0.5 tablets (5 mg total) by mouth daily.    Dispense:  30 tablet    Refill:  1  . atorvastatin (LIPITOR) 10 MG tablet    Sig: Take 1 tablet (10 mg total) by mouth daily.    Dispense:  30 tablet    Refill:  1  . metFORMIN (GLUCOPHAGE-XR) 500 MG 24 hr tablet    Sig: Take 1 tablet (500 mg total) by mouth daily with breakfast.    Dispense:  90 tablet    Refill:  1     Elberta Leatherwood, MD,MS,  PGY2 09/12/2015 12:27 PM

## 2015-09-12 NOTE — Assessment & Plan Note (Signed)
Untreated: Obtaining A1c today. Initiating metformin 500 mg daily; would strongly consider increasing dosage at follow-up visit in one week.

## 2015-09-13 LAB — T3: T3, Total: 135 ng/dL (ref 76–181)

## 2015-09-13 LAB — VITAMIN D 25 HYDROXY (VIT D DEFICIENCY, FRACTURES): VIT D 25 HYDROXY: 10 ng/mL — AB (ref 30–100)

## 2015-09-13 LAB — RPR

## 2015-09-13 LAB — HIV ANTIBODY (ROUTINE TESTING W REFLEX): HIV 1&2 Ab, 4th Generation: NONREACTIVE

## 2015-09-13 NOTE — Telephone Encounter (Signed)
Patient is aware of this.  She will discuss with her PCP when she comes for her follow up. Holly Richards,CMA

## 2015-09-17 ENCOUNTER — Ambulatory Visit (INDEPENDENT_AMBULATORY_CARE_PROVIDER_SITE_OTHER): Payer: No Typology Code available for payment source | Admitting: Family Medicine

## 2015-09-17 ENCOUNTER — Encounter: Payer: Self-pay | Admitting: Family Medicine

## 2015-09-17 VITALS — BP 141/106 | HR 89 | Temp 97.7°F | Ht 62.0 in | Wt 222.0 lb

## 2015-09-17 DIAGNOSIS — E038 Other specified hypothyroidism: Secondary | ICD-10-CM | POA: Insufficient documentation

## 2015-09-17 DIAGNOSIS — E039 Hypothyroidism, unspecified: Secondary | ICD-10-CM

## 2015-09-17 DIAGNOSIS — R748 Abnormal levels of other serum enzymes: Secondary | ICD-10-CM | POA: Insufficient documentation

## 2015-09-17 MED ORDER — LEVOTHYROXINE SODIUM 75 MCG PO TABS: 75.0000 ug | ORAL_TABLET | Freq: Every day | ORAL | Status: DC

## 2015-09-17 MED ORDER — ATORVASTATIN CALCIUM 10 MG PO TABS: 10.0000 mg | ORAL_TABLET | Freq: Every day | ORAL | Status: DC

## 2015-09-17 MED ORDER — LISINOPRIL 10 MG PO TABS: 10.0000 mg | ORAL_TABLET | Freq: Every day | ORAL | Status: DC

## 2015-09-17 NOTE — Assessment & Plan Note (Signed)
Unknown etiology however looking through her chart there is a abdominal ultrasound notes fatty liver infiltration. At this time most likely cause is due to fatty liver disease. AST/ALT elevations appear to be relatively chronic so I do not believe there is any urgent matter to address at this time. Patient is not complaining of any acute upper quadrant tenderness. - Acute hepatitis panel to be obtained in 6 weeks with her repeat TSH labs. - Atorvastatin 10 mg initiated at previous visit >> patient will likely need to have this medication dosage increased over time. - Obtaining lipid panel in 6 weeks to assess response to atorvastatin.

## 2015-09-17 NOTE — Assessment & Plan Note (Signed)
TSH is elevated. Free T4 within normal range. However, patient is currently symptomatic with reports of fatigue and possible thyromegaly on exam. - Initiating Synthroid. - Recheck TSH levels in 6-8 weeks. I've asked patient to come in for repeated TSH value in 6 weeks and to set up a follow-up appointment one week after this lab is drawn.

## 2015-09-17 NOTE — Patient Instructions (Signed)
It was a pleasure seeing you today in our clinic. Today we discussed your fatigue. Here is the treatment plan we have discussed and agreed upon together:   - The labs we obtained at your last were relatively reassuring. - I would like for you to begin taking an over-the-counter vitamin D supplement. - Increase your dose of lisinopril to a full tablet daily. - Begin taking your new medication, Synthroid. Take this tablet once daily. - I would like for you to begin working on improving your diet. I have provided instructions for a low fat and calorie diet called the Mediterranean diet. Do your best to use these recommendations as best you can. - In 6 weeks, around August 14th, please stop by our office to have new labs drawn. I have already placed these orders. Then make an appointment to be seen by your PCP or myself one week after having them drawn.     Why follow it? Research shows. . Those who follow the Mediterranean diet have a reduced risk of heart disease  . The diet is associated with a reduced incidence of Parkinson's and Alzheimer's diseases . People following the diet may have longer life expectancies and lower rates of chronic diseases  . The Dietary Guidelines for Americans recommends the Mediterranean diet as an eating plan to promote health and prevent disease  What Is the Mediterranean Diet?  . Healthy eating plan based on typical foods and recipes of Mediterranean-style cooking . The diet is primarily a plant based diet; these foods should make up a majority of meals   Starches - Plant based foods should make up a majority of meals - They are an important sources of vitamins, minerals, energy, antioxidants, and fiber - Choose whole grains, foods high in fiber and minimally processed items  - Typical grain sources include wheat, oats, barley, corn, brown rice, bulgar, farro, millet, polenta, couscous  - Various types of beans include chickpeas, lentils, fava beans, black beans,  white beans   Fruits  Veggies - Large quantities of antioxidant rich fruits & veggies; 6 or more servings  - Vegetables can be eaten raw or lightly drizzled with oil and cooked  - Vegetables common to the traditional Mediterranean Diet include: artichokes, arugula, beets, broccoli, brussel sprouts, cabbage, carrots, celery, collard greens, cucumbers, eggplant, kale, leeks, lemons, lettuce, mushrooms, okra, onions, peas, peppers, potatoes, pumpkin, radishes, rutabaga, shallots, spinach, sweet potatoes, turnips, zucchini - Fruits common to the Mediterranean Diet include: apples, apricots, avocados, cherries, clementines, dates, figs, grapefruits, grapes, melons, nectarines, oranges, peaches, pears, pomegranates, strawberries, tangerines  Fats - Replace butter and margarine with healthy oils, such as olive oil, canola oil, and tahini  - Limit nuts to no more than a handful a day  - Nuts include walnuts, almonds, pecans, pistachios, pine nuts  - Limit or avoid candied, honey roasted or heavily salted nuts - Olives are central to the [employer] - can be eaten whole or used in a variety of dishes   Meats Protein - Limiting red meat: no more than a few times a month - When eating red meat: choose lean cuts and keep the portion to the size of deck of cards - Eggs: approx. 0 to 4 times a week  - Fish and lean poultry: at least 2 a week  - Healthy protein sources include, chicken, [place], lean beef, lamb - Increase intake of seafood such as tuna, salmon, trout, mackerel, shrimp, scallops - Avoid or limit high fat processed meats  such as sausage and bacon  Dairy - Include moderate amounts of low fat dairy products  - Focus on healthy dairy such as fat free yogurt, skim milk, low or reduced fat cheese - Limit dairy products higher in fat such as whole or 2% milk, cheese, ice cream  Alcohol - Moderate amounts of red wine is ok  - No more than 5 oz daily for women (all ages) and men older than age 27   - No more than 10 oz of wine daily for men younger than 62  Other - Limit sweets and other desserts  - Use herbs and spices instead of salt to flavor foods  - Herbs and spices common to the traditional Mediterranean Diet include: basil, bay leaves, chives, cloves, cumin, fennel, garlic, lavender, marjoram, mint, oregano, parsley, pepper, rosemary, sage, savory, sumac, tarragon, thyme   It's not just a diet, it's a lifestyle:  . The Mediterranean diet includes lifestyle factors typical of those in the region  . Foods, drinks and meals are best eaten with others and savored . Daily physical activity is important for overall good health . This could be strenuous exercise like running and aerobics . This could also be more leisurely activities such as walking, housework, yard-work, or taking the stairs . Moderation is the key; a balanced and healthy diet accommodates most foods and drinks . Consider portion sizes and frequency of consumption of certain foods   Meal Ideas & Options:  . Breakfast:  o Whole wheat toast or whole wheat English muffins with peanut butter & hard boiled egg o Steel cut oats topped with apples & cinnamon and skim milk  o Fresh fruit: banana, strawberries, melon, berries, peaches  o Smoothies: strawberries, bananas, greek yogurt, peanut butter o Low fat greek yogurt with blueberries and granola  o Egg white omelet with spinach and mushrooms o Breakfast couscous: whole wheat couscous, apricots, skim milk, cranberries  . Sandwiches:  o Hummus and grilled vegetables (peppers, zucchini, squash) on whole wheat bread   o Grilled chicken on whole wheat pita with lettuce, tomatoes, cucumbers or tzatziki  o Tuna salad on whole wheat bread: tuna salad made with greek yogurt, olives, red peppers, capers, green onions o Garlic rosemary lamb pita: lamb sauted with garlic, rosemary, salt & pepper; add lettuce, cucumber, greek yogurt to pita - flavor with lemon juice and black  pepper  . Seafood:  o Mediterranean grilled salmon, seasoned with garlic, basil, parsley, lemon juice and black pepper o Shrimp, lemon, and spinach whole-grain pasta salad made with low fat greek yogurt  o Seared scallops with lemon orzo  o Seared tuna steaks seasoned salt, pepper, coriander topped with tomato mixture of olives, tomatoes, olive oil, minced garlic, parsley, green onions and cappers  . Meats:  o Herbed greek chicken salad with kalamata olives, cucumber, feta  o Red bell peppers stuffed with spinach, bulgur, lean ground beef (or lentils) & topped with feta   o Kebabs: skewers of chicken, tomatoes, onions, zucchini, squash  o [place] burgers: made with red onions, mint, dill, lemon juice, feta cheese topped with roasted red peppers . Vegetarian o Cucumber salad: cucumbers, artichoke hearts, celery, red onion, feta cheese, tossed in olive oil & lemon juice  o Hummus and whole grain pita points with a greek salad (lettuce, tomato, feta, olives, cucumbers, red onion) o Lentil soup with celery, carrots made with vegetable broth, garlic, salt and pepper  o Tabouli salad: parsley, bulgur, mint, scallions, cucumbers, tomato, radishes,  oil, salt and pepper.       

## 2015-09-17 NOTE — Progress Notes (Signed)
HPI  CC: FATIGUE Patient is here for follow-up on her fatigue. She states that since her last visit her symptoms have improved slightly but she still has issues with fatigue on a daily basis. She has been compliant with her medications so far and is interested to hear about her test results. No new changes or new symptoms since the previous visit.  Symptoms Patient complains of fatigue for the last 3 weeks The Tiredness is constant and exhausting. It is best described as exhaustion.  Fever: no Sweating at night: yes, but this has been going on for ~88yrs Weight Loss: no, up ~11lbs since December Shortness of Breath: only when "really exerting myself" Coughing up Blood: no Muscle Pain or Weakness: yes -- wrists Black or bloody Stools: no Severe Snoring or Daytime Sleepiness: no Feeling Down: denied by patient. Not enjoying things: some Rash: no Leg or Joint Swelling: yes, mostly in feet. Chest Pain or irregular heart beat: Some left sided inframammary pain -- is worse w/ exertion.   ROS - Please see HPI Smoking Status Noted   Objective: BP 141/106 mmHg  Pulse 89  Temp(Src) 97.7 F (36.5 C) (Oral)  Ht 5\' 2"  (1.575 m)  Wt 222 lb (100.699 kg)  BMI 40.59 kg/m2 Gen: NAD, alert, cooperative  HEENT: NCAT, EOMI, PERRL, neck is thick and dorsocervical fat pad hypertrophy present. No palpable thyromegaly present but patient experienced significant feelings of discomfort and dysphagia with light pressure to this region. No LAD. Neck FROM. OP clear. CV: RRR, no murmur Resp: CTAB, no wheezes, non-labored Abd: SNTND, obese. Ext: No edema, warm, muscle tone slightly increased making joints appear stiff but with full range of motion, DTRs +0 throughout. Neuro: Alert and oriented, Speech clear, No gross deficits.  Assessment and plan:  Subclinical hypothyroidism TSH is elevated. Free T4 within normal range. However, patient is currently symptomatic with reports of fatigue and possible  thyromegaly on exam. - Initiating Synthroid. - Recheck TSH levels in 6-8 weeks. I've asked patient to come in for repeated TSH value in 6 weeks and to set up a follow-up appointment one week after this lab is drawn.  Liver enzyme elevation Unknown etiology however looking through her chart there is a abdominal ultrasound notes fatty liver infiltration. At this time most likely cause is due to fatty liver disease. AST/ALT elevations appear to be relatively chronic so I do not believe there is any urgent matter to address at this time. Patient is not complaining of any acute upper quadrant tenderness. - Acute hepatitis panel to be obtained in 6 weeks with her repeat TSH labs. - Atorvastatin 10 mg initiated at previous visit >> patient will likely need to have this medication dosage increased over time. - Obtaining lipid panel in 6 weeks to assess response to atorvastatin.    Orders Placed This Encounter  Procedures  . TSH    Standing Status: Future     Number of Occurrences:      Standing Expiration Date: 09/16/2016  . Hepatitis panel, acute    Standing Status: Future     Number of Occurrences:      Standing Expiration Date: 09/16/2016  . Lipid panel    Standing Status: Future     Number of Occurrences:      Standing Expiration Date: 09/16/2016    Order Specific Question:  Has the patient fasted?    Answer:  No    Meds ordered this encounter  Medications  . atorvastatin (LIPITOR) 10 MG tablet  Sig: Take 1 tablet (10 mg total) by mouth daily.    Dispense:  30 tablet    Refill:  5  . levothyroxine (SYNTHROID, LEVOTHROID) 75 MCG tablet    Sig: Take 1 tablet (75 mcg total) by mouth daily.    Dispense:  90 tablet    Refill:  1  . lisinopril (PRINIVIL,ZESTRIL) 10 MG tablet    Sig: Take 1 tablet (10 mg total) by mouth daily.    Dispense:  30 tablet    Refill:  5     Elberta Leatherwood, MD,MS,  PGY3 09/17/2015 7:41 PM

## 2015-11-07 ENCOUNTER — Other Ambulatory Visit: Payer: No Typology Code available for payment source

## 2015-11-07 DIAGNOSIS — R748 Abnormal levels of other serum enzymes: Secondary | ICD-10-CM

## 2015-11-07 DIAGNOSIS — E039 Hypothyroidism, unspecified: Secondary | ICD-10-CM

## 2015-11-07 DIAGNOSIS — E038 Other specified hypothyroidism: Secondary | ICD-10-CM

## 2015-11-07 LAB — HEPATITIS PANEL, ACUTE
HCV Ab: NEGATIVE
HEP B S AG: NEGATIVE
Hep A IgM: NONREACTIVE
Hep B C IgM: NONREACTIVE

## 2015-11-07 LAB — LIPID PANEL
CHOL/HDL RATIO: 6 ratio — AB (ref <=5.0)
Cholesterol: 197 mg/dL (ref 125–200)
HDL: 33 mg/dL — ABNORMAL LOW (ref >=46)
LDL CALC: 101 mg/dL (ref <130)
Triglycerides: 314 mg/dL — ABNORMAL HIGH (ref <150)
VLDL: 63 mg/dL — AB (ref <30)

## 2015-11-07 LAB — TSH: TSH: 3.98 m[IU]/L

## 2016-03-14 ENCOUNTER — Encounter: Payer: Self-pay | Admitting: Family Medicine

## 2016-03-14 NOTE — Progress Notes (Signed)
I received a letter from [place]. According to the documentation it appears as though patient is being assessed for her ability to care for her niece. I have been identified by this patient as her PCP and have been asked to answer questions about her pertinent past and present medical conditions.  Unfortunately I am not patient's PCP. According to our documentation patient has not had a office visit with her new PCP. B/c of this I will provide as much documentation as I feel comfortable providing. The letter I provided is listed in patient's chart under the "letters" tab, and listed below.  Elberta Leatherwood, MD,MS,  PGY3 03/14/2016 7:22 PM  "I am writing to you today regarding Ms. Holly Richards, date of birth 27-Jun-1980. Although she has identified me as her primary care provider her true PCP is my colleague, Dr. Andy Gauss, of whom has not yet met this patient. I, on the other hand, had the pleasure of meeting this patient on 2 recent visits and I'm likely the most acquainted with this patient.   Ms. Firkus does not appear to follow-up in our clinic frequently, being that she is 35 years old and relatively healthy this is not overly surprising. Within the past 3 years patient has been seen a total of 3 times within our office doors. She does not appear to have an extensive or abusive "no show" rate with our clinic. That said, it appears as though patient was dismissed from [facility] due to either missed appointments or medication noncompliance.  Patient's diagnoses currently include (to the best of my knowledge): - Hypertension (medication: Lisinopril 10 mg) - Type 2 diabetes (medication: Metformin [phone] mg and Lipitor 10 mg) - Hypothyroidism (medication: Synthroid 75 g) - Celiac disease/sprue (medication: Bentyl 20 mg) - Turner syndrome - Bipolar disorder (no current medication according to our documentation) - Anxiety (no current medication  according to our documentation)  Depending on the severity of her celiac disease patient is likely on some other type of controller medication for this. Depending on the severity of her bipolar disorder/anxiety, patient was likely started on some type of antidepressant. It does not appear as though patient is currently on this medication. As long as she has no recurrence of the symptoms I do not feel strongly that this would have to be restarted rather than simply monitored. [However, see below.]  I have no current knowledge of whether or not patient is compliant with medications. However, on my first visit with her I had noted a significant difference in the amount of prescriptions patient was supposed to be taking versus what she was actually taking. At that visit I deleted for medications from her regular medication list, only one seems to be of interest: "Geodon 20 mg at bedtime" which was likely helping to treat her bipolar condition. I make no note in my documentation from that visit of patient acting strange or inappropriate, however I did note a screening score consistent with moderate depressive symptoms and moderate anxiety symptoms.  Digging deeper into patient's history it appears as though patient has a history of suicidal thoughts and psychiatric related hospital admission(s) back in 2009. 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 She was discharged from that hospital visit to follow-up with a "Dr. Toy Care" who was her psychiatrist at the time.  To the best of my knowledge  I do not believe this patient has any CURRENT severe physical or mental health limitations. That said, when a child's well-being is at stake I do not feel as though I have enough of a relationship to have confidence in this judgment.   Due to my very limited relationship with this patient, I would  strongly recommend trying to contact Dr. Toy Care and/or his clinic for additional insight into her psychiatric history. I hope this was helpful.  Sincerely,  Georges Lynch, MD"

## 2016-05-09 ENCOUNTER — Encounter: Payer: Self-pay | Admitting: Family Medicine

## 2016-05-09 ENCOUNTER — Telehealth: Payer: Self-pay | Admitting: Family Medicine

## 2016-05-09 DIAGNOSIS — Z0000 Encounter for general adult medical examination without abnormal findings: Secondary | ICD-10-CM

## 2016-05-09 NOTE — Telephone Encounter (Signed)
Patient called back and would like to know if we can fax the orders to her at [place] (place of employment) [phone].  Informed patient that provider was in clinic today but it may be Monday before we can get the orders to her.  She voiced understanding and just wants to make sure her labs get covered.  They will needed to be ordered for [facility].  Jazmin Hartsell,CMA

## 2016-05-09 NOTE — Telephone Encounter (Signed)
Pt has a followup appt 05-16-16.  She is wanting to get her blood drawn at [facility] where she works prior to the visit.  She has [employer] copy for her insurance. Please advise

## 2016-05-09 NOTE — Telephone Encounter (Signed)
Will forward to MD. Kalman Nylen,CMA  

## 2016-05-12 NOTE — Telephone Encounter (Signed)
Patient is aware that lab orders were faxed to number she gave me on Friday.  Anderson Middlebrooks,CMA

## 2016-05-16 ENCOUNTER — Encounter: Payer: Self-pay | Admitting: Family Medicine

## 2016-05-16 ENCOUNTER — Ambulatory Visit: Payer: No Typology Code available for payment source | Admitting: Family Medicine

## 2016-05-16 ENCOUNTER — Ambulatory Visit (INDEPENDENT_AMBULATORY_CARE_PROVIDER_SITE_OTHER): Payer: Commercial Managed Care - PPO | Admitting: Family Medicine

## 2016-05-16 VITALS — BP 130/84 | HR 78 | Temp 97.8°F | Ht 62.0 in | Wt 215.4 lb

## 2016-05-16 DIAGNOSIS — E119 Type 2 diabetes mellitus without complications: Secondary | ICD-10-CM | POA: Diagnosis not present

## 2016-05-16 DIAGNOSIS — E785 Hyperlipidemia, unspecified: Secondary | ICD-10-CM | POA: Diagnosis not present

## 2016-05-16 DIAGNOSIS — E039 Hypothyroidism, unspecified: Secondary | ICD-10-CM

## 2016-05-16 MED ORDER — METFORMIN HCL 1000 MG PO TABS: 1000.0000 mg | ORAL_TABLET | Freq: Two times a day (BID) | ORAL | 3 refills | Status: DC

## 2016-05-16 MED ORDER — LEVOTHYROXINE SODIUM 75 MCG PO TABS: 75.0000 ug | ORAL_TABLET | Freq: Every day | ORAL | 1 refills | Status: DC

## 2016-05-16 MED ORDER — ATORVASTATIN CALCIUM 20 MG PO TABS: 20.0000 mg | ORAL_TABLET | Freq: Every day | ORAL | 3 refills | Status: DC

## 2016-05-16 MED ORDER — FISH OIL 435 MG PO CAPS: 435.0000 mg | ORAL_CAPSULE | Freq: Two times a day (BID) | ORAL | 1 refills | Status: DC

## 2016-05-16 NOTE — Progress Notes (Signed)
   Subjective:    Patient ID: Holly Richards, female    DOB: [birth year], 36 y.o.   MRN: [id number]   CC: Hyperlipidemia, DM2, hypothyroidism and hypertension follow up  HPI: Holly Richards is a 36 yo female with a past medical history significant for Dm type 2, hyperlipidemia, hypothyroidism, Celiac disease who present today to follow up on management of  DM2, hyperlipidemia and hypothyroidism.  DM2: Patient has an increase in her A1c from 8.2 to 11.1. Patient currently taking Metformin 500 mg daily. Patient admits to drinking up to 2L of soft drink a day. Patient also started to work out two days ago.  Hypertension: Patient has been taking her lisinopril 10 mg daily and denies any headaches, palpitations, SOB or chest pain.  Hypothyroidism: Patient currently unclear what dosage she is taking. Patient is supposed to be on 75 mg but she thinks that she has been taking 25 mcg daily.  Hyperlipidemia: Patient is currently on Lipitor 10 mg daily and reports adherence to prescription.  Smoking status reviewed   ROS: all other systems were reviewed and are negative other than in the HPI   Past medical history, surgical, family, and social history reviewed and updated in the EMR as appropriate.  Objective:  BP 130/84   Pulse 78   Temp 97.8 F (36.6 C) (Oral)   Ht 5\' 2"  (1.575 m)   Wt 215 lb 6.4 oz (97.7 kg)   LMP 03/17/2006 (Approximate)   SpO2 98%   BMI 39.40 kg/m   Vitals and nursing note reviewed  General: NAD, pleasant, able to participate in exam Cardiac: RRR, normal heart sounds, no murmurs. 2+ radial and PT pulses bilaterally Respiratory: CTAB, normal effort, No wheezes, rales or rhonchi Abdomen: soft, nontender, nondistended, no hepatic or splenomegaly, +BS Extremities: no edema or cyanosis. WWP. Skin: warm and dry, no rashes noted Neuro: alert and oriented x4, no focal deficits Psych: Normal affect and mood   Assessment & Plan:   #Diabetes Mellitus type 2,  uncontrolled Patient A1c is 11.6 up from 8.2. Patient will work on lifestyle changes by modifying her diet and exercising more. --Will start patient on Metformin 1000 mg daily --Patient was given handout on DM diet  #Hyperlidemia, uncontrolled Patient cholesterol and triglycerides significantly increase since August 2017. Patient has been adherent to her medication regimen. Given elevated triglycerides diet changes have been addressed --Will increase Lipitor to 20 mg daily  --Will start patient on Fish oil 435 mg bid  #Hypothyroidism, uncontrolled Patient's TSH level is elevated. Patient reports taking 25 mcg daily. Patient MAR indicate that patient should be on 75 mcg daily started back in July 2017. Patient will verify dosage if 25 mcg will increase to prescribed 75 mg. If she has been taking 75 mcg will take adjust dosage. --Will verify dosage and adjust accordingly.  #Hypertension Patient's BP today is 130/82, seemed to be well controlled at the moment though SBP ideally should be <130 given DM2 and hyperlipidemia. --Will continue Lisinopril 10 mg daily --Will see patient in about 4 weeks, will adjust therapy as needed     Marjie Skiff, MD Family Medicine Resident PGY-1

## 2016-05-16 NOTE — Patient Instructions (Signed)
It was great seeing you today! We have addressed the following issues today  1. I increase your metformin to 1000 mg daily, and your lipitor to 20 mg daily.  2. Check levothyroxine dosage and will adjust as needed I will see you in about a monthDiabetes Mellitus and Food It is important for you to manage your blood sugar (glucose) level. Your blood glucose level can be greatly affected by what you eat. Eating healthier foods in the appropriate amounts throughout the day at about the same time each day will help you control your blood glucose level. It can also help slow or prevent worsening of your diabetes mellitus. Healthy eating may even help you improve the level of your blood pressure and reach or maintain a healthy weight. General recommendations for healthful eating and cooking habits include: Eating meals and snacks regularly. Avoid going long periods of time without eating to lose weight. Eating a diet that consists mainly of plant-based foods, such as fruits, vegetables, nuts, legumes, and whole grains. Using low-heat cooking methods, such as baking, instead of high-heat cooking methods, such as deep frying. Work with your dietitian to make sure you understand how to use the Nutrition Facts information on food labels. How can food affect me? Carbohydrates  Carbohydrates affect your blood glucose level more than any other type of food. Your dietitian will help you determine how many carbohydrates to eat at each meal and teach you how to count carbohydrates. Counting carbohydrates is important to keep your blood glucose at a healthy level, especially if you are using insulin or taking certain medicines for diabetes mellitus. Alcohol  Alcohol can cause sudden decreases in blood glucose (hypoglycemia), especially if you use insulin or take certain medicines for diabetes mellitus. Hypoglycemia can be a life-threatening condition. Symptoms of hypoglycemia (sleepiness, dizziness, and  disorientation) are similar to symptoms of having too much alcohol. If your health care provider has given you approval to drink alcohol, do so in moderation and use the following guidelines: Women should not have more than one drink per day, and men should not have more than two drinks per day. One drink is equal to: 12 oz of beer. 5 oz of wine. 1 oz of hard liquor. Do not drink on an empty stomach. Keep yourself hydrated. Have water, diet soda, or unsweetened iced tea. Regular soda, juice, and other mixers might contain a lot of carbohydrates and should be counted. What foods are not recommended? As you make food choices, it is important to remember that all foods are not the same. Some foods have fewer nutrients per serving than other foods, even though they might have the same number of calories or carbohydrates. It is difficult to get your body what it needs when you eat foods with fewer nutrients. Examples of foods that you should avoid that are high in calories and carbohydrates but low in nutrients include: Trans fats (most processed foods list trans fats on the Nutrition Facts label). Regular soda. Juice. Candy. Sweets, such as cake, pie, doughnuts, and cookies. Fried foods. What foods can I eat? Eat nutrient-rich foods, which will nourish your body and keep you healthy. The food you should eat also will depend on several factors, including: The calories you need. The medicines you take. Your weight. Your blood glucose level. Your blood pressure level. Your cholesterol level. You should eat a variety of foods, including: Protein. Lean cuts of meat. Proteins low in saturated fats, such as fish, egg whites, and beans. Avoid  processed meats. Fruits and vegetables. Fruits and vegetables that may help control blood glucose levels, such as apples, mangoes, and yams. Dairy products. Choose fat-free or low-fat dairy products, such as milk, yogurt, and cheese. Grains, bread, pasta,  and rice. Choose whole grain products, such as multigrain bread, whole oats, and brown rice. These foods may help control blood pressure. Fats. Foods containing healthful fats, such as nuts, avocado, olive oil, canola oil, and fish. Does everyone with diabetes mellitus have the same meal plan? Because every person with diabetes mellitus is different, there is not one meal plan that works for everyone. It is very important that you meet with a dietitian who will help you create a meal plan that is just right for you. This information is not intended to replace advice given to you by your health care provider. Make sure you discuss any questions you have with your health care provider. Document Released: 11/28/2004 Document Revised: 08/09/2015 Document Reviewed: 01/28/2013 Elsevier Interactive Patient Education  2017 [employer]. 3.    If we did any lab work today, and the results require attention, either me or my nurse will get in touch with you. If everything is normal, you will get a letter in mail and a message via . If you don't hear from [place] in two weeks, please give [place] a call. Otherwise, we look forward to seeing you again at your next visit. If you have any questions or concerns before then, please call the clinic at [phone].  Please bring all your medications to every doctors visit  Sign up for My Chart to have easy access to your labs results, and communication with your Primary care physician.    Please check-out at the front desk before leaving the clinic.    Take Care,   Dr.Sabella Traore

## 2016-05-19 ENCOUNTER — Telehealth: Payer: Self-pay | Admitting: Family Medicine

## 2016-05-19 DIAGNOSIS — E039 Hypothyroidism, unspecified: Secondary | ICD-10-CM

## 2016-05-19 NOTE — Telephone Encounter (Signed)
Patient is aware of this and will see provider in 1 month. Holly Richards,CMA

## 2016-05-19 NOTE — Telephone Encounter (Signed)
Holly Richards,  Please could you call patient and let her know that she will need to increase her synthroid to 100 mcg since her TSH was too high on 75 mcg. We will check her TSH at our next visit and will adjust dosage as needed.  Thanks  Marjie Skiff, PGY-1

## 2016-05-19 NOTE — Telephone Encounter (Signed)
Pt is calling to inform the doctor that she is taking 75 mg of synthroid. jw

## 2016-05-20 ENCOUNTER — Encounter: Payer: Self-pay | Admitting: Family Medicine

## 2016-05-20 ENCOUNTER — Telehealth: Payer: Self-pay | Admitting: Family Medicine

## 2016-05-20 MED ORDER — FISH OIL 435 MG PO CAPS: 435.0000 mg | ORAL_CAPSULE | Freq: Two times a day (BID) | ORAL | 1 refills | Status: DC

## 2016-05-20 NOTE — Telephone Encounter (Signed)
Medication resent to pharmacy. Jacobi Nile,CMA  

## 2016-05-20 NOTE — Telephone Encounter (Signed)
Pt says the pharmacy doesn't have her fish oil RX. Please resend [facility] [place].  Please call pt back when this has [facility] to [facility].

## 2016-05-21 NOTE — Telephone Encounter (Signed)
Pharmacy doesn't have 425 mg fish oil. Please resend RX with a different mg amt.  [facility] [place].

## 2016-05-22 MED ORDER — OMEGA-3-ACID ETHYL ESTERS 1 G PO CAPS: 1.0000 g | ORAL_CAPSULE | Freq: Two times a day (BID) | ORAL | 2 refills | Status: DC

## 2016-05-22 NOTE — Addendum Note (Signed)
Addended by: Marjie Skiff F on: 05/22/2016 12:59 PM   Modules accepted: Orders

## 2016-06-07 ENCOUNTER — Encounter: Payer: Self-pay | Admitting: Family Medicine

## 2016-06-09 ENCOUNTER — Other Ambulatory Visit: Payer: Self-pay | Admitting: *Deleted

## 2016-06-09 DIAGNOSIS — E785 Hyperlipidemia, unspecified: Secondary | ICD-10-CM

## 2016-06-09 DIAGNOSIS — E119 Type 2 diabetes mellitus without complications: Secondary | ICD-10-CM

## 2016-06-09 DIAGNOSIS — E039 Hypothyroidism, unspecified: Secondary | ICD-10-CM

## 2016-06-09 MED ORDER — LISINOPRIL 10 MG PO TABS: 10.0000 mg | ORAL_TABLET | Freq: Every day | ORAL | 5 refills | Status: DC

## 2016-06-09 MED ORDER — OMEGA-3-ACID ETHYL ESTERS 1 G PO CAPS: 1.0000 g | ORAL_CAPSULE | Freq: Two times a day (BID) | ORAL | 2 refills | Status: DC

## 2016-06-09 MED ORDER — LEVOTHYROXINE SODIUM 75 MCG PO TABS: 75.0000 ug | ORAL_TABLET | Freq: Every day | ORAL | 1 refills | Status: DC

## 2016-06-09 MED ORDER — METFORMIN HCL 1000 MG PO TABS: 1000.0000 mg | ORAL_TABLET | Freq: Two times a day (BID) | ORAL | 3 refills | Status: DC

## 2016-06-09 MED ORDER — ATORVASTATIN CALCIUM 20 MG PO TABS: 20.0000 mg | ORAL_TABLET | Freq: Every day | ORAL | 3 refills | Status: DC

## 2016-06-20 ENCOUNTER — Ambulatory Visit: Payer: Commercial Managed Care - PPO | Admitting: Family Medicine

## 2016-07-01 ENCOUNTER — Telehealth: Payer: Self-pay | Admitting: Internal Medicine

## 2016-07-01 NOTE — Telephone Encounter (Signed)
**  After Hours/ Emergency Line Call*  Received a call to report that Harvel Quale has been having frequent urination, urinary urgency, and dysuria. Taking azo but not helping. Reports of nausea but no fevers or chills. No flank pain. Believes she has a UTI.   Recommend making a same day appointment today, however patient has work today. Made a same day appointment for 4/18. Red flags discussed.  Will forward to PCP.   Smiley Houseman, MD PGY-2, [facility] [facility]

## 2016-07-09 ENCOUNTER — Encounter: Payer: Self-pay | Admitting: Family Medicine

## 2016-07-09 DIAGNOSIS — E039 Hypothyroidism, unspecified: Secondary | ICD-10-CM

## 2016-07-10 ENCOUNTER — Encounter: Payer: Self-pay | Admitting: *Deleted

## 2016-07-11 ENCOUNTER — Encounter: Payer: Self-pay | Admitting: Family Medicine

## 2016-07-11 ENCOUNTER — Ambulatory Visit (INDEPENDENT_AMBULATORY_CARE_PROVIDER_SITE_OTHER): Payer: Commercial Managed Care - PPO | Admitting: Family Medicine

## 2016-07-11 VITALS — BP 128/88 | HR 89 | Temp 97.7°F | Ht 62.0 in | Wt 210.6 lb

## 2016-07-11 DIAGNOSIS — E039 Hypothyroidism, unspecified: Secondary | ICD-10-CM | POA: Diagnosis not present

## 2016-07-11 DIAGNOSIS — E119 Type 2 diabetes mellitus without complications: Secondary | ICD-10-CM | POA: Diagnosis not present

## 2016-07-11 LAB — POCT GLYCOSYLATED HEMOGLOBIN (HGB A1C): Hemoglobin A1C: 8.6

## 2016-07-11 NOTE — Progress Notes (Signed)
   Subjective:    Patient ID: Holly Richards, female    DOB: December 27, 1980, 36 y.o.   MRN: [id number]   CC: Follow up for hypothyroidism  HPI: Patient is 36 yo with a past medical history significant for Type 2 DM, HLD, hypothyroidism and celiac disease who present today to follow on her lab results and her hypothyroidism specifically. Patient had elevated TSH (9.5) while on levothyroxine 75 mg. Dose was increased to 110 mg. Patient has been in her normal state of health and has been making change in her diet and trying to exercise more. Patient denies fatigue, hair loss, weight gain, or change in bowel habits.   Smoking status reviewed   ROS: all other systems were reviewed and are negative other than in the HPI   Past medical history, surgical, family, and social history reviewed and updated in the EMR as appropriate.  Objective:  BP 128/88   Pulse 89   Temp 97.7 F (36.5 C) (Oral)   Ht 5\' 2"  (1.575 m)   Wt 210 lb 9.6 oz (95.5 kg)   SpO2 97%   BMI 38.52 kg/m   Vitals and nursing note reviewed  General: NAD, pleasant, able to participate in exam Cardiac: RRR, normal heart sounds, no murmurs. 2+ radial and PT pulses bilaterally Respiratory: CTAB, normal effort, No wheezes, rales or rhonchi Abdomen: soft, nontender, nondistended, no hepatic or splenomegaly, +BS Extremities: no edema or cyanosis. WWP. Skin: warm and dry, no rashes noted Neuro: alert and oriented x4, no focal deficits Psych: Normal affect and mood   Assessment & Plan:   #Hypothyroidism, controlled Patient's TSH is still elevated to 7.3 but improved from 9.5 at last visit. Patient should be on 156 microgram based 1.6 ug/kg dose for levothyroxine. Will continue to up titrate synthroid and will monitor TSH. --Will increase Levothryroxine dose to 150 ug --Will check TSH in about 8 weeks and determine need for adjustment  #Type 2 DM Patient A1c was accidentally checked and showed significant improvement from 11.1  to 8.6 after increase in metformin at last visit from 500 mg to a 1000 mg. Improvement also fit weight loss and dietary change made since last visit. --Will continue to monitor and recheck A1c at next visit in about 8 weeks --Continue current treatment management  Marjie Skiff, MD Family Medicine Resident PGY-1

## 2016-07-11 NOTE — Patient Instructions (Addendum)
It was great seeing you today! We have addressed the following issues today  1. We will increase your Levothyroxine  (synthroid) to 150 ug from 100ug because it still elevated but has improved 2. Continue to take your metformin, lisinopril and cholesterol medications as prescribed as your lab showed improvement in all those areas. 3. I will see you in about 8 weeks or so to follow up on your TSH and A1c. 4. Continue to work on your diet and integrate exercise in your routine.  If we did any lab work today, and the results require attention, either me or my nurse will get in touch with you. If everything is normal, you will get a letter in mail and a message via . If you don't hear from [place] in two weeks, please give [place] a call. Otherwise, we look forward to seeing you again at your next visit. If you have any questions or concerns before then, please call the clinic at [phone].  Please bring all your medications to every doctors visit  Sign up for My Chart to have easy access to your labs results, and communication with your Primary care physician.    Please check-out at the front desk before leaving the clinic.    Take Care,   Dr. Andy Gauss

## 2016-07-12 ENCOUNTER — Encounter: Payer: Self-pay | Admitting: Family Medicine

## 2016-08-17 ENCOUNTER — Other Ambulatory Visit: Payer: Self-pay | Admitting: Family Medicine

## 2016-08-17 DIAGNOSIS — E785 Hyperlipidemia, unspecified: Secondary | ICD-10-CM

## 2016-10-14 ENCOUNTER — Encounter: Payer: Self-pay | Admitting: Family Medicine

## 2016-10-17 ENCOUNTER — Other Ambulatory Visit: Payer: Self-pay | Admitting: Family Medicine

## 2016-10-17 DIAGNOSIS — E039 Hypothyroidism, unspecified: Secondary | ICD-10-CM

## 2016-10-17 DIAGNOSIS — E119 Type 2 diabetes mellitus without complications: Secondary | ICD-10-CM

## 2016-10-21 ENCOUNTER — Telehealth: Payer: Self-pay

## 2016-10-21 NOTE — Telephone Encounter (Signed)
I have faxed lab orders to pts place of employment. Pt contacted and notified of this.

## 2016-10-21 NOTE — Telephone Encounter (Signed)
-----   Message from Marjie Skiff, MD sent at 10/17/2016  1:59 AM EDT ----- Hi Page,  I have placed two lab orders (A1c, TSH) for this patient, she wants [place] to fax them to her so she can have them done at an workplace, insurance issue. Please could you fax them to this number [phone] and let patient know that it is done.   Thank you   Marjie Skiff, MD [place], PGY-2

## 2016-11-10 ENCOUNTER — Other Ambulatory Visit: Payer: Self-pay | Admitting: Family Medicine

## 2016-11-10 DIAGNOSIS — E039 Hypothyroidism, unspecified: Secondary | ICD-10-CM

## 2017-01-30 ENCOUNTER — Other Ambulatory Visit: Payer: Self-pay | Admitting: Family Medicine

## 2017-01-30 DIAGNOSIS — E039 Hypothyroidism, unspecified: Secondary | ICD-10-CM

## 2017-03-28 ENCOUNTER — Other Ambulatory Visit: Payer: Self-pay | Admitting: Family Medicine

## 2017-03-28 DIAGNOSIS — E039 Hypothyroidism, unspecified: Secondary | ICD-10-CM

## 2017-04-03 ENCOUNTER — Other Ambulatory Visit: Payer: Self-pay | Admitting: Family Medicine

## 2017-04-03 DIAGNOSIS — E785 Hyperlipidemia, unspecified: Secondary | ICD-10-CM

## 2017-04-03 DIAGNOSIS — E119 Type 2 diabetes mellitus without complications: Secondary | ICD-10-CM

## 2017-05-07 ENCOUNTER — Encounter: Payer: Self-pay | Admitting: Family Medicine

## 2017-05-20 ENCOUNTER — Telehealth: Payer: Self-pay | Admitting: Family Medicine

## 2017-05-20 ENCOUNTER — Encounter: Payer: Self-pay | Admitting: Family Medicine

## 2017-05-20 ENCOUNTER — Other Ambulatory Visit: Payer: Self-pay

## 2017-05-20 ENCOUNTER — Ambulatory Visit (INDEPENDENT_AMBULATORY_CARE_PROVIDER_SITE_OTHER): Payer: Commercial Managed Care - PPO | Admitting: Family Medicine

## 2017-05-20 VITALS — BP 126/84 | HR 89 | Temp 97.7°F | Ht 63.0 in | Wt 202.0 lb

## 2017-05-20 DIAGNOSIS — R5383 Other fatigue: Secondary | ICD-10-CM

## 2017-05-20 DIAGNOSIS — E039 Hypothyroidism, unspecified: Secondary | ICD-10-CM | POA: Diagnosis not present

## 2017-05-20 DIAGNOSIS — E119 Type 2 diabetes mellitus without complications: Secondary | ICD-10-CM | POA: Diagnosis not present

## 2017-05-20 LAB — POCT GLYCOSYLATED HEMOGLOBIN (HGB A1C): Hemoglobin A1C: 11.8

## 2017-05-20 MED ORDER — ONETOUCH DELICA LANCETS 33G MISC: 1 refills | Status: DC

## 2017-05-20 MED ORDER — ASPIRIN EC 81 MG PO TBEC: 81.0000 mg | DELAYED_RELEASE_TABLET | Freq: Every day | ORAL | 3 refills | Status: DC

## 2017-05-20 MED ORDER — ONETOUCH VERIO W/DEVICE KIT: 1.0000 | PACK | Freq: Every day | 0 refills | Status: DC

## 2017-05-20 MED ORDER — GLUCOSE BLOOD VI STRP: ORAL_STRIP | 12 refills | Status: DC

## 2017-05-20 MED ORDER — INSULIN GLARGINE 100 UNIT/ML SOLOSTAR PEN: 20.0000 [IU] | PEN_INJECTOR | Freq: Every day | SUBCUTANEOUS | 99 refills | Status: DC

## 2017-05-20 MED ORDER — LANCET DEVICE MISC: 0 refills | Status: DC

## 2017-05-20 NOTE — Telephone Encounter (Signed)
**  After Hours/ Emergency Line Call*  Received a call to report that Harvel Quale was started on insulin today at her clinic visit. She was not able to pick up the needles for her insulin, states that the pharmacist sent over a request for the correct needles. She had previously been using metformin 1000mg  daily, states she has this and her CBG meter. States her metformin was increased to 1000mg  BID today. She was worried that something would happen if she didn't start her Lantus today. Offered pharm appt with Dr. Valentina Lucks in the am, but patient has to work. Stated that I would send this message to Dr. Andy Gauss and ask him to look for request for needles and possibly give her a call to help her sort out the correct needles. Recommended that she call back if she feels unwell and CBG is abnormal. Red flags discussed.  Will forward to PCP.  Ralene Ok, MD PGY-2, [facility]

## 2017-05-20 NOTE — Assessment & Plan Note (Addendum)
Today A1c is 11.8.  Significant increase from last A1c of 8.3.  This is also consistent with polyuria and polydipsia endorsed by patient.  Given symptoms multiple comorbidities, we will adjust therapy.  We also discussed exercise and diet, which patient promised to keep working on. --Start patient on Lantus 20 units daily --Increase metformin to 2000 mg daily --Order glucometer patient will check blood glucose 3 times a day --Increase atorvastatin 40 mg daily --Prescribe aspirin 81 mg daily --Follow-up on CMP

## 2017-05-20 NOTE — Progress Notes (Signed)
   Subjective:    Patient ID: Harvel Quale, female    DOB: 02-14-1981, 37 y.o.   MRN: [id number]   CC: Follow-up for type 2 diabetes   HPI: Patient is a 37 year old female who is here today to follow-up on her type II diabetes.  Patient reports that she has been taking metformin 500 mg 2 times a day.  Patient endorses increased thirst as well as polyuria for the past few months.  Last A1c was 8.2.  Patient is concerned that her diabetes is worsening.  Patient also endorses some fatigue but denies any chest pain, shortness of breath, abdominal pain, nausea, vomiting headaches, vision changes.  Smoking status reviewed   ROS: all other systems were reviewed and are negative other than in the HPI   Past Medical History:  Diagnosis Date  . Anxiety   . Bipolar disorder ([place])   . Migraines   . Thyroid disease     Past Surgical History:  Procedure Laterality Date  . CHOLECYSTECTOMY    . COLONOSCOPY    . CORNEAL TRANSPLANT      Past medical history, surgical, family, and social history reviewed and updated in the EMR as appropriate.  Objective:  BP 126/84   Pulse 89   Temp 97.7 F (36.5 C) (Oral)   Ht 5\' 3"  (1.6 m)   Wt 202 lb (91.6 kg)   SpO2 98%   BMI 35.78 kg/m   Vitals and nursing note reviewed  General: NAD, pleasant, able to participate in exam Cardiac: RRR, normal heart sounds, no murmurs. 2+ radial and PT pulses bilaterally Respiratory: CTAB, normal effort, No wheezes, rales or rhonchi Abdomen: soft, nontender, nondistended, no hepatic or splenomegaly, +BS Extremities: no edema or cyanosis. WWP. Skin: warm and dry, no rashes noted Neuro: alert and oriented x4, no focal deficits Psych: Normal affect and mood   Assessment & Plan:    Type 2 diabetes mellitus without complication, without long-term current use of insulin (HCC) Today A1c is 11.8.  Significant increase from last A1c of 8.3.  This is also consistent with polyuria and polydipsia endorsed by patient.   Given symptoms multiple comorbidities, we will adjust therapy.  We also discussed exercise and diet, which patient promised to keep working on. --Start patient on Lantus 20 units daily --Increase metformin to 2000 mg daily --Order glucometer patient will check blood glucose 3 times a day --Increase atorvastatin 40 mg daily --Prescribe aspirin 81 mg daily  Hypothyroidism Patient is currently taking levothyroxine.  Last TSH check from CMP was within normal limit.  However patient had another TSH checked at work a few months ago which was slightly elevated.  We will repeat TSH today in follow-up on results.  Patient may need adjustment of further evaluation of her hypothyroidism.  #Fatigue Patient has has a history of anemia in the past.  Fatigue could be secondary to worsening type 2 diabetes.  She also has a history of hypothyroidism that was uncontrolled in the past which could also lead to increased tiredness.  Fatigue will likely improve with better glycemic control. Will check CBC and follow-up on the results.    Marjie Skiff, MD [place] PGY-2

## 2017-05-20 NOTE — Assessment & Plan Note (Addendum)
Patient is currently taking levothyroxine.  Last TSH check from CMP was within normal limit.  However patient had another TSH checked at work a few months ago which was slightly elevated.  We will repeat TSH today in follow-up on results.  Patient may need adjustment of further evaluation of her hypothyroidism.

## 2017-05-20 NOTE — Patient Instructions (Addendum)
It was great seeing you today! We have addressed the following issues today  1. I want you to increase your metformin to 2000 mg a day. 2. I am starting you on Lantus 20u daily. 3. I also order a glucometer, I want you to check your Blood Glucose in the morning before breakfast and 2 hours after lunch and dinner. 4. Increase your atorvastatin to 40 daily. 5. I will start you also on aspirin 81 mg.  If we did any lab work today, and the results require attention, either me or my nurse will get in touch with you. If everything is normal, you will get a letter in mail and a message via . If you don't hear from [place] in two weeks, please give [place] a call. Otherwise, we look forward to seeing you again at your next visit. If you have any questions or concerns before then, please call the clinic at [phone].  Please bring all your medications to every doctors visit  Sign up for My Chart to have easy access to your labs results, and communication with your Primary care physician. Please ask Front Desk for some assistance.   Please check-out at the front desk before leaving the clinic.    Take Care,   Dr. Andy Gauss

## 2017-05-20 NOTE — Telephone Encounter (Signed)
Thanks Anda Kraft, I talked to the patient and will follow up.  Marjie Skiff, MD [place], PGY-2

## 2017-05-21 ENCOUNTER — Other Ambulatory Visit: Payer: Self-pay | Admitting: *Deleted

## 2017-05-21 ENCOUNTER — Encounter: Payer: Self-pay | Admitting: Family Medicine

## 2017-05-21 LAB — CMP14+EGFR
A/G RATIO: 1.6 (ref 1.2–2.2)
ALBUMIN: 4.6 g/dL (ref 3.5–5.5)
ALT: 132 IU/L — ABNORMAL HIGH (ref 0–32)
AST: 74 IU/L — ABNORMAL HIGH (ref 0–40)
Alkaline Phosphatase: 185 IU/L — ABNORMAL HIGH (ref 39–117)
BUN / CREAT RATIO: 12 (ref 9–23)
BUN: 6 mg/dL (ref 6–20)
Bilirubin Total: 0.4 mg/dL (ref 0.0–1.2)
CALCIUM: 10 mg/dL (ref 8.7–10.2)
CHLORIDE: 98 mmol/L (ref 96–106)
CO2: 24 mmol/L (ref 20–29)
Creatinine, Ser: 0.49 mg/dL — ABNORMAL LOW (ref 0.57–1.00)
GFR, EST AFRICAN AMERICAN: 146 mL/min/{1.73_m2} (ref >59)
GFR, EST NON AFRICAN AMERICAN: 127 mL/min/{1.73_m2} (ref >59)
Globulin, Total: 2.9 g/dL (ref 1.5–4.5)
Glucose: 268 mg/dL — ABNORMAL HIGH (ref 65–99)
Potassium: 4 mmol/L (ref 3.5–5.2)
Sodium: 139 mmol/L (ref 134–144)
TOTAL PROTEIN: 7.5 g/dL (ref 6.0–8.5)

## 2017-05-21 LAB — CBC WITH DIFFERENTIAL/PLATELET
BASOS: 0 %
Basophils Absolute: 0 10*3/uL (ref 0.0–0.2)
EOS (ABSOLUTE): 0.3 10*3/uL (ref 0.0–0.4)
EOS: 4 %
HEMATOCRIT: 41.3 % (ref 34.0–46.6)
HEMOGLOBIN: 14.3 g/dL (ref 11.1–15.9)
IMMATURE GRANS (ABS): 0 10*3/uL (ref 0.0–0.1)
Immature Granulocytes: 0 %
LYMPHS: 38 %
Lymphocytes Absolute: 2.7 10*3/uL (ref 0.7–3.1)
MCH: 28.8 pg (ref 26.6–33.0)
MCHC: 34.6 g/dL (ref 31.5–35.7)
MCV: 83 fL (ref 79–97)
MONOCYTES: 4 %
Monocytes Absolute: 0.3 10*3/uL (ref 0.1–0.9)
Neutrophils Absolute: 3.9 10*3/uL (ref 1.4–7.0)
Neutrophils: 54 %
Platelets: 257 10*3/uL (ref 150–379)
RBC: 4.96 x10E6/uL (ref 3.77–5.28)
RDW: 13.4 % (ref 12.3–15.4)
WBC: 7.2 10*3/uL (ref 3.4–10.8)

## 2017-05-21 LAB — TSH: TSH: 7.4 u[IU]/mL — AB (ref 0.450–4.500)

## 2017-05-21 MED ORDER — PEN NEEDLES 31G X 8 MM MISC: 1 refills | Status: DC

## 2017-06-28 ENCOUNTER — Telehealth: Payer: Self-pay | Admitting: Family Medicine

## 2017-06-28 NOTE — Telephone Encounter (Signed)
Emergency Line / After Hours Call  Received page that patient was calling after hours at 9:30a this morning. Page was inadvertently routed by operator to my pager rather than the on call upper level resident.  Called and spoke with patient. She reports the last 3 mornings she has woken up with a fever, temps 100-101. No pain or symptoms anywhere. Says her temps usually run 94-95 F. History of type 2 diabetes, sugars currently in the 200s. 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 She otherwise feels well.  Explained patient may have virus, UTI, or other source of infection. Recommend she call the clinic in the AM to schedule a visit to be evaluated. Patient appreciative.  FYI to PCP.  Chrisandra Netters, MD

## 2017-11-30 ENCOUNTER — Telehealth: Payer: Self-pay | Admitting: Family Medicine

## 2017-11-30 NOTE — Telephone Encounter (Signed)
**  After Hours/ Emergency Line Call* Received a call this morning about patient blood glucose that have been elevated. This morning fasting was >200's. Patient wanted to know what she needed to do in regards to her elevated BG. She is currently on lantus 20u in the am. Asked patient to increase her lantus to 25u and titrate for a goal Fasting BG around 120. She can follow up in clinic for further management. She will continue with Metfromin 2000 mg as well.   Marjie Skiff, MD [place], PGY-3

## 2018-01-18 ENCOUNTER — Other Ambulatory Visit: Payer: Self-pay | Admitting: Family Medicine

## 2018-01-18 DIAGNOSIS — E039 Hypothyroidism, unspecified: Secondary | ICD-10-CM

## 2018-01-18 DIAGNOSIS — E119 Type 2 diabetes mellitus without complications: Secondary | ICD-10-CM

## 2018-05-18 ENCOUNTER — Other Ambulatory Visit: Payer: Self-pay

## 2018-05-18 ENCOUNTER — Ambulatory Visit (INDEPENDENT_AMBULATORY_CARE_PROVIDER_SITE_OTHER): Payer: 59 | Admitting: Family Medicine

## 2018-05-18 ENCOUNTER — Encounter: Payer: Self-pay | Admitting: Family Medicine

## 2018-05-18 VITALS — BP 134/80 | HR 85 | Temp 98.4°F

## 2018-05-18 DIAGNOSIS — E039 Hypothyroidism, unspecified: Secondary | ICD-10-CM

## 2018-05-18 DIAGNOSIS — R748 Abnormal levels of other serum enzymes: Secondary | ICD-10-CM | POA: Diagnosis not present

## 2018-05-18 DIAGNOSIS — E119 Type 2 diabetes mellitus without complications: Secondary | ICD-10-CM | POA: Diagnosis not present

## 2018-05-18 LAB — POCT GLYCOSYLATED HEMOGLOBIN (HGB A1C): HbA1c, POC (controlled diabetic range): 12.7 % — AB (ref 0.0–7.0)

## 2018-05-18 MED ORDER — DULAGLUTIDE 0.75 MG/0.5ML ~~LOC~~ SOAJ: 0.7500 mg | SUBCUTANEOUS | 1 refills | Status: DC

## 2018-05-18 MED ORDER — INSULIN ASPART 100 UNIT/ML FLEXPEN: 5.0000 [IU] | PEN_INJECTOR | Freq: Two times a day (BID) | SUBCUTANEOUS | 0 refills | Status: DC

## 2018-05-18 NOTE — Progress Notes (Signed)
   Subjective:    Patient ID: Holly Richards, female    DOB: 03-Sep-1980, 37 y.o.   MRN: [id number]   CC: Follow-up for diabetes  HPI: Patient is a 38 year old female who past medical history significant for type 2 diabetes, hypothyroidism, hypertension, hyperlipidemia and Turner syndrome present today to follow-up on type 2 diabetes management.  Patient reports that she has not taken any of her medication over the past few months.  She reports that she got a new job and has been traveling since October for it.  She has been unable to make follow-up appointment during that time.  She was recently seen at urgent care and had a blood glucose in the 300s.  She has not checked her blood glucose at home the past few months.  Patient is here today to discuss starting regimen.  Patient denies any polyuria polydipsia.  Patient recently had blood work at her job which showed normal BMP but elevated TSH to 19.0.    Smoking status reviewed   ROS: all other systems were reviewed and are negative other than in the HPI   Past Medical History:  Diagnosis Date  . Anxiety   . Bipolar disorder ([place])   . Migraines   . Thyroid disease     Past Surgical History:  Procedure Laterality Date  . CHOLECYSTECTOMY    . COLONOSCOPY    . CORNEAL TRANSPLANT      Past medical history, surgical, family, and social history reviewed and updated in the EMR as appropriate.  Objective:  BP 134/80   Pulse 85   Temp 98.4 F (36.9 C) (Oral)   SpO2 97%   Vitals and nursing note reviewed  General: NAD, pleasant, able to participate in exam Cardiac: RRR, normal heart sounds, no murmurs. 2+ radial and PT pulses bilaterally Respiratory: CTAB, normal effort, No wheezes, rales or rhonchi Abdomen: soft, nontender, nondistended, no hepatic or splenomegaly, +BS Extremities: no edema or cyanosis. WWP. Skin: warm and dry, no rashes noted Neuro: alert and oriented x4, no focal deficits Psych: Normal affect and  mood   Assessment & Plan:   Type 2 diabetes mellitus without complication, without long-term current use of insulin (HCC) A1c today is 12.7.  Expected given lack of medication adherence for the past few months.  We will start patient on following regimen: --Lantus 25 units in the morning daily --NovoLog 5 units with 2 biggest meal --Trulicity 0.75mg  once a week --Discontinue metformin due to poor tolerance.  Will consider starting patient on glipizide once A1c drops below 10. --Recheck A1c in 3 months   Hypothyroidism Patient last TSH done earlier this month at an outside lab showed a TSH of 19.0 no free T4 or T3.  Will repeat today and adjust medication as needed.  Patient will resume levothyroxine 75 mcg daily. --Order TSH, free T4, T3   Patient will resume remaining medications.  ASA, atorvastatin, levothyroxine, lisinopril   Marjie Skiff, MD [place] PGY-3

## 2018-05-18 NOTE — Patient Instructions (Addendum)
It was great seeing you today! We have addressed the following issues today  1. I want you to restart and increase your Lantus to 25 units in the morning. 2. I am starting you on Novolog 5 units with your two biggest meals. 3. I am starting you on Trulicity 6.27 mg once a week. 4. Stop metformin, restart aspirin, lisinopril and levothyroxine we might need to adjust it based on your lab results. 5. Restart atorvastatin at 40 mg a day for your cholesterol.  If we did any lab work today, and the results require attention, either me or my nurse will get in touch with you. If everything is normal, you will get a letter in mail and a message via . If you don't hear from [place] in two weeks, please give [place] a call. Otherwise, we look forward to seeing you again at your next visit. If you have any questions or concerns before then, please call the clinic at [phone].  Please bring all your medications to every doctors visit  Sign up for My Chart to have easy access to your labs results, and communication with your Primary care physician. Please ask Front Desk for some assistance.   Please check-out at the front desk before leaving the clinic.    Take Care,   Dr. Andy Gauss

## 2018-05-18 NOTE — Assessment & Plan Note (Addendum)
Patient last TSH done earlier this month at an outside lab showed a TSH of 19.0 no free T4 or T3.  Will repeat today and adjust medication as needed.  Patient will resume levothyroxine 75 mcg daily. --Order TSH, free T4, T3

## 2018-05-18 NOTE — Progress Notes (Deleted)
   Subjective:    Patient ID: Holly Richards, female    DOB: 06/05/1980, 38 y.o.   MRN: [id number]   CC:  HPI:   Smoking status reviewed   ROS: all other systems were reviewed and are negative other than in the HPI   Past Medical History:  Diagnosis Date  . Anxiety   . Bipolar disorder ([place])   . Migraines   . Thyroid disease     Past Surgical History:  Procedure Laterality Date  . CHOLECYSTECTOMY    . COLONOSCOPY    . CORNEAL TRANSPLANT      Past medical history, surgical, family, and social history reviewed and updated in the EMR as appropriate.  Objective:  BP 134/80   Pulse 85   Temp 98.4 F (36.9 C) (Oral)   SpO2 97%   Vitals and nursing note reviewed  General: NAD, pleasant, able to participate in exam Cardiac: RRR, normal heart sounds, no murmurs. 2+ radial and PT pulses bilaterally Respiratory: CTAB, normal effort, No wheezes, rales or rhonchi Abdomen: soft, nontender, nondistended, no hepatic or splenomegaly, +BS Extremities: no edema or cyanosis. WWP. Skin: warm and dry, no rashes noted Neuro: alert and oriented x4, no focal deficits Psych: Normal affect and mood   Assessment & Plan:    No problem-specific Assessment & Plan notes found for this encounter.    Marjie Skiff, MD [place] PGY-3

## 2018-05-18 NOTE — Assessment & Plan Note (Addendum)
A1c today is 12.7.  Expected given lack of medication adherence for the past few months.  We will start patient on following regimen: --Lantus 25 units in the morning daily --NovoLog 5 units with 2 biggest meal --Trulicity 0.75mg  once a week --Discontinue metformin due to poor tolerance.  Will consider starting patient on glipizide once A1c drops below 10. --Order lipid panel, recent BMP within normal limits --Recheck A1c in 3 months

## 2018-05-19 LAB — LIPID PANEL
Chol/HDL Ratio: 15.8 ratio — ABNORMAL HIGH (ref 0.0–4.4)
Cholesterol, Total: 380 mg/dL — ABNORMAL HIGH (ref 100–199)
HDL: 24 mg/dL — ABNORMAL LOW (ref >39)
Triglycerides: 1299 mg/dL (ref 0–149)

## 2018-05-19 LAB — TSH: TSH: 16.36 u[IU]/mL — ABNORMAL HIGH (ref 0.450–4.500)

## 2018-05-19 LAB — T3: T3, Total: 100 ng/dL (ref 71–180)

## 2018-05-19 LAB — T4, FREE: Free T4: 0.69 ng/dL — ABNORMAL LOW (ref 0.82–1.77)

## 2018-05-20 ENCOUNTER — Telehealth: Payer: Self-pay | Admitting: Family Medicine

## 2018-05-20 ENCOUNTER — Other Ambulatory Visit: Payer: Self-pay | Admitting: Family Medicine

## 2018-05-20 DIAGNOSIS — E119 Type 2 diabetes mellitus without complications: Secondary | ICD-10-CM

## 2018-05-20 DIAGNOSIS — E782 Mixed hyperlipidemia: Secondary | ICD-10-CM

## 2018-05-20 MED ORDER — EMPAGLIFLOZIN 10 MG PO TABS: 10.0000 mg | ORAL_TABLET | Freq: Every day | ORAL | 1 refills | Status: DC

## 2018-05-20 MED ORDER — EZETIMIBE 10 MG PO TABS: 10.0000 mg | ORAL_TABLET | Freq: Every day | ORAL | 3 refills | Status: DC

## 2018-05-20 NOTE — Telephone Encounter (Signed)
Called and discussed with patient her abnormal lipid panel. We will discontinue he trulicity given risk of pancreatitis with triglycerides >1200. I will start her on Jardiance and Zetia and increase her atorvastatin to 80 mg given her Cholesterol of 382. Patient verbalized understanding and will follow up with me next week by phone for updates.

## 2018-05-24 ENCOUNTER — Telehealth: Payer: Self-pay | Admitting: *Deleted

## 2018-05-24 ENCOUNTER — Other Ambulatory Visit: Payer: Self-pay | Admitting: Family Medicine

## 2018-05-24 DIAGNOSIS — E119 Type 2 diabetes mellitus without complications: Secondary | ICD-10-CM

## 2018-05-24 NOTE — Telephone Encounter (Signed)
Pharmacy need directions on basaglar.  Please resend script. Fleeger, Salome Spotted, CMA

## 2018-05-25 ENCOUNTER — Other Ambulatory Visit: Payer: Self-pay | Admitting: Family Medicine

## 2018-05-25 DIAGNOSIS — E119 Type 2 diabetes mellitus without complications: Secondary | ICD-10-CM

## 2018-05-25 MED ORDER — BASAGLAR KWIKPEN 100 UNIT/ML ~~LOC~~ SOPN: 25.0000 [IU] | PEN_INJECTOR | Freq: Every day | SUBCUTANEOUS | 1 refills | Status: DC

## 2018-05-25 NOTE — Telephone Encounter (Signed)
Script resend with order to do 25 units daily.   Thanks  Marjie Skiff, MD [place], PGY-3

## 2018-06-14 ENCOUNTER — Other Ambulatory Visit: Payer: Self-pay | Admitting: Family Medicine

## 2018-06-14 DIAGNOSIS — E119 Type 2 diabetes mellitus without complications: Secondary | ICD-10-CM

## 2018-06-20 ENCOUNTER — Other Ambulatory Visit: Payer: Self-pay | Admitting: Family Medicine

## 2018-06-20 DIAGNOSIS — E119 Type 2 diabetes mellitus without complications: Secondary | ICD-10-CM

## 2018-06-23 ENCOUNTER — Telehealth (INDEPENDENT_AMBULATORY_CARE_PROVIDER_SITE_OTHER): Payer: 59 | Admitting: Family Medicine

## 2018-06-23 DIAGNOSIS — H10022 Other mucopurulent conjunctivitis, left eye: Secondary | ICD-10-CM | POA: Diagnosis not present

## 2018-06-23 NOTE — Telephone Encounter (Signed)
[facility] Telemedicine Visit  Patient consented to have visit conducted via telephone.  Encounter participants: Patient: Holly Richards  Provider: [facility]   Chief Complaint: pink eye  HPI:  Patient reports a few days of redness of left eye which she attributes to conjunctivitis. No known sick contacts. Right eye has not been affected. H/o corneal transplant of affected eye back in 2009 at [facility]. Denies change in vision, pain with extraocular movement, discharge. Reports temp of 99.37F which is "high for her." Denies cough or SOB.  ROS: See HPI.  Pertinent PMHx: Reviewed/  Exam:  Respiratory: talking in complete sentences. No cough.  Assessment/Plan:  Pink eye disease, left Acute and mild in severity per pt report. Likely viral, no h/o bacterial superinfection. H/o corneal transplant which is over 10 years ago. No red flags. - Advised to call [facility] in order for clearance to return to hospital - Reviewed return precautions and when to call opthalmologist    Time spent on phone with patient: 15 minutes

## 2018-06-23 NOTE — Assessment & Plan Note (Signed)
Acute and mild in severity per pt report. Likely viral, no h/o bacterial superinfection. H/o corneal transplant which is over 10 years ago. No red flags. - Advised to call [facility] in order for clearance to return to hospital - Reviewed return precautions and when to call opthalmologist

## 2018-07-13 ENCOUNTER — Ambulatory Visit: Payer: 59 | Admitting: Family Medicine

## 2018-07-13 ENCOUNTER — Telehealth: Payer: Self-pay

## 2018-07-13 ENCOUNTER — Ambulatory Visit: Payer: 59

## 2018-07-13 NOTE — Telephone Encounter (Signed)
Page,  Please inform patient that it is ok to miss this afternoon appt. Please let patient know that I will call her on Friday to chat with her about her diabetes management ( Jardiance, etc). The most important part of her regimen is insulin, as long as she is using her insulin she should be fine.   Thank you  Marjie Skiff, MD [place], PGY-3

## 2018-07-13 NOTE — Telephone Encounter (Signed)
Pt called nurse line stating she can not make her apt this afternoon for diabetes management due to work. Pt stated she wasn't sure if she should come in anyway bc she hasn't started the medication (jardiance) yet, and the apt was to FU with medication. Per note on 3/5, I do not see where she was suppose to start this, and she is saying the pharmacy does not have it. Please advise.

## 2018-07-13 NOTE — Telephone Encounter (Signed)
Pt informed she will get a call on Friday.

## 2018-08-03 ENCOUNTER — Telehealth: Payer: Self-pay | Admitting: *Deleted

## 2018-08-03 NOTE — Telephone Encounter (Signed)
Received fax from pharmacy, PA needed on Jardiance.  Clinical questions submitted via Cover My Meds.  Approved 08/03/2018 - 08/02/2021, Pharmacy informed.   Cover My Meds info: Key: [id number]    Christen Bame, CMA

## 2018-09-01 ENCOUNTER — Telehealth: Payer: Self-pay | Admitting: *Deleted

## 2018-09-01 NOTE — Telephone Encounter (Signed)
Pt calls because she has been on a diabetic diet x 3 weeks.  She checked her BS a few minutes ago and it was 111. She wants to know if she should take her lunch novolog.   Spoke with MD.  He instructs:  - pt not to take lunch novolog - check sugars in 2 - 3 hours. If BS is 200+ to take novolog with dinner.  If less than 200 do not take.   Pt is agreeable with plan and states that she did pass out last week.  We discuss how her meds may need to be changed now that she is eating better.  She is agreeable to appt on Monday.  She is due for an A1c. Christen Bame, CMA

## 2018-09-06 ENCOUNTER — Other Ambulatory Visit: Payer: Self-pay

## 2018-09-06 ENCOUNTER — Encounter: Payer: Self-pay | Admitting: Family Medicine

## 2018-09-06 ENCOUNTER — Ambulatory Visit (INDEPENDENT_AMBULATORY_CARE_PROVIDER_SITE_OTHER): Payer: 59 | Admitting: Family Medicine

## 2018-09-06 VITALS — BP 110/78 | HR 85

## 2018-09-06 DIAGNOSIS — E782 Mixed hyperlipidemia: Secondary | ICD-10-CM | POA: Diagnosis not present

## 2018-09-06 DIAGNOSIS — E039 Hypothyroidism, unspecified: Secondary | ICD-10-CM | POA: Diagnosis not present

## 2018-09-06 DIAGNOSIS — E119 Type 2 diabetes mellitus without complications: Secondary | ICD-10-CM

## 2018-09-06 LAB — POCT GLYCOSYLATED HEMOGLOBIN (HGB A1C): HbA1c, POC (controlled diabetic range): 9.6 % — AB (ref 0.0–7.0)

## 2018-09-06 NOTE — Progress Notes (Signed)
   Subjective:    Patient ID: Holly Richards, female    DOB: 1981-02-04, 38 y.o.   MRN: [id number]   CC: Follow up for Diabetes  HPI: Patient is a 38 yo female with a past medical history significant for T2DM, turner syndrome, hyperlipidemia, hypothyroidism who present today to follow up on management of her diabetes. Patient reports she has been taking her Lantus and Novolog as prescribed. She has had two hypoglycemic episodes. She reports her fasting BG have been ranging between 90-130. She has also started a new diabetic diet and has lost 20 lbs. She is also adherent to her Levothyroxine, atorvastatin and Zetia. She denies any polyuria, polidipsia, nausea, vomiting, abdominal pain.   Smoking status reviewed   ROS: all other systems were reviewed and are negative other than in the HPI   Past Medical History:  Diagnosis Date  . Anxiety   . Bipolar disorder ([place])   . Migraines   . Thyroid disease     Past Surgical History:  Procedure Laterality Date  . CHOLECYSTECTOMY    . COLONOSCOPY    . CORNEAL TRANSPLANT      Past medical history, surgical, family, and social history reviewed and updated in the EMR as appropriate.  Objective:  BP 110/78   Pulse 85   SpO2 98%   Vitals and nursing note reviewed  General: NAD, pleasant, able to participate in exam Cardiac: RRR, normal heart sounds, no murmurs. 2+ radial and PT pulses bilaterally Respiratory: CTAB, normal effort, No wheezes, rales or rhonchi Abdomen: soft, nontender, nondistended, no hepatic or splenomegaly, +BS Extremities: no edema or cyanosis. WWP. Skin: warm and dry, no rashes noted Neuro: alert and oriented x4, no focal deficits Psych: Normal affect and mood   Assessment & Plan:   Type 2 diabetes mellitus without complication, without long-term current use of insulin (HCC) A1c today 9.6 much improved from 12.7 three months ago. Patient will continue with Lantus 25 units. Given hypoglycemic events discuss sliding  scale with NovoLog patient will use if blood glucose 2 hours after meals are both 200.  She is currently on 5 units a day with biggest meal.  She also continue to take her Jardiance as discussed previously.  Follow-up in 3 months.  Hypothyroidism We will repeat TSH today.  Patient was nonadherent to her levothyroxine at previous office visit and had a TSH that was elevated with low free T4.  Suspect improved level given adherence to medication.  Mixed hyperlipidemia Repeat lipid panel today.  Patient currently on Zetia and atorvastatin for abnormal lipid panel with triglycerides >1000 and cholesterol >300.  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  Will adjust medication as needed.    Marjie Skiff, MD [place] PGY-3

## 2018-09-06 NOTE — Assessment & Plan Note (Signed)
Repeat lipid panel today.  Patient currently on Zetia and atorvastatin for abnormal lipid panel with triglycerides >1000 and cholesterol >300.  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  Will adjust medication as needed.

## 2018-09-06 NOTE — Assessment & Plan Note (Signed)
A1c today 9.6 much improved from 12.7 three months ago. Patient will continue with Lantus 25 units. Given hypoglycemic events discuss sliding scale with NovoLog patient will use if blood glucose 2 hours after meals are both 200.  She is currently on 5 units a day with biggest meal.  She also continue to take her Jardiance as discussed previously.  Follow-up in 3 months.

## 2018-09-06 NOTE — Assessment & Plan Note (Signed)
We will repeat TSH today.  Patient was nonadherent to her levothyroxine at previous office visit and had a TSH that was elevated with low free T4.  Suspect improved level given adherence to medication.

## 2018-09-06 NOTE — Patient Instructions (Addendum)
It was great seeing you today! We have addressed the following issues today  1. Your A1c is 9.6 down from 12.7. Good job, keep taking your medications and working on diet and exercise. 2. I will check your cholesterol and Thyroid level to see if we need to make any adjustment.  If we did any lab work today, and the results require attention, either me or my nurse will get in touch with you. If everything is normal, you will get a letter in mail and a message via . If you don't hear from [place] in two weeks, please give [place] a call. Otherwise, we look forward to seeing you again at your next visit. If you have any questions or concerns before then, please call the clinic at [phone].  Please bring all your medications to every doctors visit  Sign up for My Chart to have easy access to your labs results, and communication with your Primary care physician. Please ask Front Desk for some assistance.   Please check-out at the front desk before leaving the clinic.    Take Care,   Dr. Andy Gauss

## 2018-09-07 LAB — LIPID PANEL
Chol/HDL Ratio: 4.4 ratio (ref 0.0–4.4)
Cholesterol, Total: 137 mg/dL (ref 100–199)
HDL: 31 mg/dL — ABNORMAL LOW (ref >39)
LDL Calculated: 63 mg/dL (ref 0–99)
Triglycerides: 214 mg/dL — ABNORMAL HIGH (ref 0–149)
VLDL Cholesterol Cal: 43 mg/dL — ABNORMAL HIGH (ref 5–40)

## 2018-09-07 LAB — TSH: TSH: 5.18 u[IU]/mL — ABNORMAL HIGH (ref 0.450–4.500)

## 2018-10-06 ENCOUNTER — Other Ambulatory Visit: Payer: Self-pay

## 2018-10-06 DIAGNOSIS — E119 Type 2 diabetes mellitus without complications: Secondary | ICD-10-CM

## 2018-10-06 DIAGNOSIS — E785 Hyperlipidemia, unspecified: Secondary | ICD-10-CM

## 2018-10-06 DIAGNOSIS — E039 Hypothyroidism, unspecified: Secondary | ICD-10-CM

## 2018-10-06 DIAGNOSIS — E782 Mixed hyperlipidemia: Secondary | ICD-10-CM

## 2018-10-06 MED ORDER — LANTUS SOLOSTAR 100 UNIT/ML ~~LOC~~ SOPN: PEN_INJECTOR | SUBCUTANEOUS | 2 refills | Status: DC

## 2018-10-06 MED ORDER — ATORVASTATIN CALCIUM 20 MG PO TABS: 20.0000 mg | ORAL_TABLET | Freq: Every day | ORAL | 3 refills | Status: DC

## 2018-10-06 MED ORDER — EMPAGLIFLOZIN 10 MG PO TABS: 10.0000 mg | ORAL_TABLET | Freq: Every day | ORAL | 1 refills | Status: DC

## 2018-10-06 MED ORDER — EZETIMIBE 10 MG PO TABS: 10.0000 mg | ORAL_TABLET | Freq: Every day | ORAL | 3 refills | Status: DC

## 2018-10-06 MED ORDER — NOVOLOG FLEXPEN 100 UNIT/ML ~~LOC~~ SOPN: 5.0000 [IU] | PEN_INJECTOR | Freq: Two times a day (BID) | SUBCUTANEOUS | 0 refills | Status: DC

## 2018-10-06 MED ORDER — LISINOPRIL 10 MG PO TABS: 10.0000 mg | ORAL_TABLET | Freq: Every day | ORAL | 0 refills | Status: DC

## 2018-10-06 MED ORDER — LEVOTHYROXINE SODIUM 75 MCG PO TABS: 75.0000 ug | ORAL_TABLET | Freq: Every day | ORAL | 0 refills | Status: DC

## 2018-12-15 ENCOUNTER — Encounter: Payer: Self-pay | Admitting: Family Medicine

## 2018-12-17 ENCOUNTER — Other Ambulatory Visit: Payer: Self-pay | Admitting: Family Medicine

## 2018-12-17 ENCOUNTER — Other Ambulatory Visit: Payer: Self-pay

## 2018-12-17 DIAGNOSIS — E119 Type 2 diabetes mellitus without complications: Secondary | ICD-10-CM

## 2018-12-17 MED ORDER — NOVOLOG FLEXPEN 100 UNIT/ML ~~LOC~~ SOPN: 5.0000 [IU] | PEN_INJECTOR | Freq: Two times a day (BID) | SUBCUTANEOUS | 0 refills | Status: DC

## 2018-12-23 ENCOUNTER — Encounter: Payer: Self-pay | Admitting: Pharmacist

## 2018-12-23 ENCOUNTER — Other Ambulatory Visit: Payer: Self-pay

## 2018-12-23 ENCOUNTER — Ambulatory Visit (INDEPENDENT_AMBULATORY_CARE_PROVIDER_SITE_OTHER): Payer: 59 | Admitting: Pharmacist

## 2018-12-23 DIAGNOSIS — E119 Type 2 diabetes mellitus without complications: Secondary | ICD-10-CM | POA: Diagnosis not present

## 2018-12-23 MED ORDER — BASAGLAR KWIKPEN 100 UNIT/ML ~~LOC~~ SOPN: 20.0000 [IU] | PEN_INJECTOR | Freq: Every day | SUBCUTANEOUS | Status: DC

## 2018-12-23 MED ORDER — OZEMPIC (0.25 OR 0.5 MG/DOSE) 2 MG/1.5ML ~~LOC~~ SOPN: 0.2500 mg | PEN_INJECTOR | SUBCUTANEOUS | 1 refills | Status: DC

## 2018-12-23 MED ORDER — NOVOLOG FLEXPEN 100 UNIT/ML ~~LOC~~ SOPN: 7.0000 [IU] | PEN_INJECTOR | Freq: Three times a day (TID) | SUBCUTANEOUS | Status: DC

## 2018-12-23 MED ORDER — OZEMPIC (0.25 OR 0.5 MG/DOSE) 2 MG/1.5ML ~~LOC~~ SOPN: 0.2500 mg | PEN_INJECTOR | SUBCUTANEOUS | 0 refills | Status: DC

## 2018-12-23 NOTE — Progress Notes (Signed)
Reviewed: I agree with Dr. Koval's documentation and management. 

## 2018-12-23 NOTE — Assessment & Plan Note (Signed)
Diabetes diagnosed ~ 2017 currently uncontrolled with 4x daily nocturia and consistent blood glucose readings > 200.  Patient is adherent with medication. Control is suboptimal due to unhealthy diet and increased insulin demands.  -Decrease basal insulin Basaglar (insulin glargine) from 25 units daily to 20 units daily -Increase rapid insulin Novolog (insulin aspart) from 5 units once/twice daily to 7 units three times daily. -Initiate GLP-1 Ozempic (generic name semaglutide) 0.25 mg weekly for 4 weeks.   -Continue SGLT2-I Jardiance (generic name empagliflozin) 10 mg daily -Extensively discussed pathophysiology of DM, recommended lifestyle interventions, dietary effects on glycemic control -Counseled on s/sx of and management of hypoglycemia -Next A1C anticipated at f/u appt.

## 2018-12-23 NOTE — Progress Notes (Signed)
S:     Chief Complaint  Patient presents with  . Medication Management    Diabetes    Patient arrives in good spirits, ambulating without assistance.  Presents for diabetes evaluation, education, and management Patient was referred on 12/15/18  by current PCP, Dr. Grandville Silos.  Patient was last seen by prior PCP, Dr. Earna Coder, on 09/06/18. She reports she takes insulin glargine 25 units in the morning. She takes insulin aspart 5 units before breakfast. She will eat lunch and check her BG 2 hours later; if BG >200 then she will administer an additional 5 units of insulin aspart. She was discontinued off of metformin, but was unsure of why. She reports she still takes empagliflozin. She checks her BG in the morning prior to eating and 2 hours after lunch. Pt is interested in starting to use dexcom meter since it is covered by her insurance.   Patient reports Diabetes was diagnosed in (2017- based on problem list???).   Family/Social History: mother (alcoholic), father (alcoholic)  Insurance coverage/medication affordability: [employer] [facility] [employer] --pt reports "she never has to pay"  Patient reports adherence with medications.  Current diabetes medications include: insulin basaglar 25 units daily, insulin aspart 5 units with breakfast, empagliflozin 10 mg daily Current hypertension medications include: lisinopril 10 mg daily Current hyperlipidemia medications include: atorvastatin 60 mg daily, ezetimibe 10 mg daily, omega-3 acid ethyl esters 1 g BID  Patient denies hypoglycemic events.  Patient reported dietary habits: Eats 3 meals/day - approximately the same size  Dinner: rice, tacos, pasta, potatoes, likes vegetables (states they are inconvenient), canned fruit  Drinks: diet soda, 1 gallon of water, occassionally alcohol   Patient reports nocturia. ~ 4x per night    O:  Physical Exam Vitals signs reviewed.  Constitutional:      Appearance: She is obese.  Neurological:     Mental  Status: She is oriented to person, place, and time.  Psychiatric:        Mood and Affect: Mood normal.        Behavior: Behavior normal.        Thought Content: Thought content normal.        Judgment: Judgment normal.    Review of Systems  Constitutional: Positive for malaise/fatigue.  HENT: Negative.   Eyes: Negative.   Respiratory: Negative.   Cardiovascular: Negative.   Gastrointestinal:       Has IBS  Genitourinary: Positive for frequency.  Musculoskeletal: Negative.   Skin: Negative.        Bruising   Neurological: Negative.   Endo/Heme/Allergies: Positive for polydipsia.  Psychiatric/Behavioral:       Feeling overwhelmed     Lab Results  Component Value Date   HGBA1C 9.6 (A) 09/06/2018   There were no vitals filed for this visit.  Lipid Panel     Component Value Date/Time   CHOL 137 09/06/2018 1628   TRIG 214 (H) 09/06/2018 1628   HDL 31 (L) 09/06/2018 1628   CHOLHDL 4.4 09/06/2018 1628   CHOLHDL 6.0 (H) 11/07/2015 1109   VLDL 63 (H) 11/07/2015 1109   LDLCALC 63 09/06/2018 1628   LDLDIRECT 145.1 12/05/2013 1626    All CBG readings: ~200 --Lowest BG reading she reports ~180   A/P: Diabetes diagnosed ~ 2017 currently uncontrolled with 4x daily nocturia and consistent blood glucose readings > 200.  Patient is adherent with medication. Control is suboptimal due to unhealthy diet and increased insulin demands.  -Decrease basal insulin Basaglar (insulin glargine)  from 25 units daily to 20 units daily -Increase rapid insulin Novolog (insulin aspart) from 5 units once/twice daily to 7 units three times daily. -Initiate GLP-1 Ozempic (generic name semaglutide) 0.25 mg weekly for 4 weeks.   -Continue SGLT2-I Jardiance (generic name empagliflozin) 10 mg daily -Extensively discussed pathophysiology of DM, recommended lifestyle interventions, dietary effects on glycemic control -Counseled on s/sx of and management of hypoglycemia -Next A1C anticipated at f/u appt.    ASCVD risk - primary prevention. Last LDL is controlled.  -Continue aspirin 81 mg  -Continue atorvastatin 80 mg.   Hypertension longstanding currently uncontrolled today however she has been at goal recently.  BP goal = <130/80 mmHg. Patient is adherent with medication. Control is suboptimal due to multiple life stressors.   -Continue lisinopril 10 mg daily  Written patient instructions provided.  Total time in face to face counseling 60 minutes.   Follow up Pharmacist Clinic Visit in 4-6 weeks.   Patient seen with Donald Prose, PharmD Candidate, Eddie Candle, PharmD PGY-1 Resident and Drexel Iha, PharmD,  PGY2 Pharmacy Resident.

## 2018-12-23 NOTE — Patient Instructions (Addendum)
Start Ozempic 0.25mg  once weekly.  Please let us know if nausea or vomiting is concerning/severe.  Mild nausea should improve with continued use.   Decrease your Basaglar from 25 units to 20 units once daily in the morning.    Increase your Novolog to 7 units PRIOR to meals THREE times daily.     Continue taking Jardiance 10mg  daily.     Bring your fish oil (essential oil) supplement so we can determine the dose of your agent.   We will plan to all you in 5-7 days.    Please plan to have a follow-up with Pharmacy Clinic in 4-6 weeks.

## 2018-12-24 ENCOUNTER — Telehealth: Payer: Self-pay

## 2018-12-24 NOTE — Telephone Encounter (Signed)
Prior Authorization for [employer] received by fax from [place]. I submitted PA through cover my meds. I will recheck on Monday.   Key: [id number]

## 2018-12-28 NOTE — Telephone Encounter (Signed)
Clinical questions submitted via cover my meds. Pharmacy has been notified.

## 2018-12-28 NOTE — Telephone Encounter (Signed)
Clinical questions placed in [place] box for completion.

## 2018-12-29 NOTE — Telephone Encounter (Signed)
Did not add in previous documentation... the medication was approved.

## 2018-12-30 ENCOUNTER — Telehealth: Payer: Self-pay | Admitting: Pharmacist

## 2018-12-30 MED ORDER — DEXCOM G6 SENSOR MISC: 1.0000 | Freq: Once | 11 refills | Status: AC

## 2018-12-30 MED ORDER — DEXCOM G6 TRANSMITTER MISC: 1.0000 | Freq: Once | 4 refills | Status: AC

## 2018-12-30 MED ORDER — DEXCOM G6 RECEIVER DEVI: 1.0000 | 11 refills | Status: DC

## 2018-12-30 NOTE — Addendum Note (Signed)
Addended by: Ellwood Handler on: 12/30/2018 05:44 PM   Modules accepted: Orders

## 2018-12-30 NOTE — Telephone Encounter (Signed)
Called pt at 5:30 PM on 12/30/18.  Pt states she has been checking BG daily in the morning and frequently throughout the day. She states her fasting blood glucose was 129 this morning, however, has typically been ranging ~200-220. She states she has noticed a significant decrease from fasting blood glucose readings of 280 - >300 after initiation of semaglutide (Ozempic) 0.25 mg weekly, insulin glargine (Lantus) 20 units daily, and insulin aspart (Novolog) 7 units before meals . She cannot remember blood glucose readings in the evening. She is tolerating Ozempic and new dosing of basal/bolus insulin regimen. She continues to take empagliflozin (Jardiance) 10 mg daily. She is not experiencing s/sx of hypoglycemia.   Explained to pt the process of obtaining dexcom G6 (rx sent to pharmacy --> prior authorization from insurance --> pt picks up dexcom G6 from pharmacy --> pt makes appt with Dr. Janeann Forehand for dexcom education). Also, educated pt that the dexcom G6 comes in three parts: receiver, transmitter, and sensor. Pt verbalized understanding.  Will plan to f/u with pt in one week.

## 2018-12-31 ENCOUNTER — Telehealth: Payer: Self-pay | Admitting: Family Medicine

## 2018-12-31 ENCOUNTER — Encounter: Payer: Self-pay | Admitting: Sports Medicine

## 2018-12-31 NOTE — Telephone Encounter (Signed)
Per Dr. Valentina Lucks no PA needed.  Holly Richards, CMA

## 2018-12-31 NOTE — Telephone Encounter (Signed)
Nothing from pharmacy yet.  Will check into PA.  Christen Bame, CMA

## 2018-12-31 NOTE — Telephone Encounter (Signed)
Returned call from patient RE the receipt of her DEXCOM CGM meter.    She was happy to have the device and also reports improved blood sugar control with recent medication adjustments.   Scheduled visit 10/22 at 3:30 PM to set up device.  Patient was relieved and excited to have this plan in place.

## 2018-12-31 NOTE — Telephone Encounter (Signed)
She received her Dexcom monitor and wants to know if she should schedule an appointment with you know, or what to do next?  Please call her back at [phone].

## 2019-01-05 ENCOUNTER — Telehealth: Payer: Self-pay | Admitting: Pharmacist

## 2019-01-05 NOTE — Telephone Encounter (Signed)
Called pt on 01/05/19.  Called to verify pt was able to obtain the 3 pieces that make up the dexcom G6 continuous blood glucose monitor (sensor, transmitter, receiver) [facility] at 1[facility]. Pt confirmed she was able to pick up sensor and transmitter. However, pt was not able to pick up receiver. Pharmacy staff member told her that the receiver was obtained from the doctor's office (???). It appears there was confusion in how to order dexcom G6 receiver.   [facility] and instructed pharmacist how to order dexcom G6 receiver by providing her with [place] number. Pharmacist confirmed that she would be able to order it and that it would be shipped to pharmacy by tomorrow (01/06/19). Pharmacist stated the copay would be $0.  Called pt at 5:30 PM to update her and let her know that she would be able to pick up dexcom G6 receiver tomorrow. Discussed with pt that we will be able to apply sensor and transmitter at appt tomorrow. Since pt has an iphone, informed pt that we will use her iphone as the receiver for now. She will pick up dexcom G6 receiver at pharmacy to use as back up in case her phone is damaged in the future. Instructed pt to download dexcom G6 app and clarity apps on phone prior to appt. Pt verbalized understanding.

## 2019-01-06 ENCOUNTER — Encounter: Payer: Self-pay | Admitting: Pharmacist

## 2019-01-06 ENCOUNTER — Other Ambulatory Visit: Payer: Self-pay

## 2019-01-06 ENCOUNTER — Ambulatory Visit (INDEPENDENT_AMBULATORY_CARE_PROVIDER_SITE_OTHER): Payer: 59 | Admitting: Pharmacist

## 2019-01-06 DIAGNOSIS — E119 Type 2 diabetes mellitus without complications: Secondary | ICD-10-CM | POA: Diagnosis not present

## 2019-01-06 NOTE — Progress Notes (Signed)
S:     Chief Complaint  Patient presents with  . Medication Management    Diabetes, Dexcom Device    Patient arrives in good spirits and ambulating well.  Pt reports she is tolerating Ozempic. Presents for diabetes evaluation, education, and management. Patient was referred on 12/15/18  by current PCP, Dr. Grandville Silos.  Patient was last seen by prior PCP, Dr. Earna Coder, on 09/06/18.  Patient reports for Dexcom device application and education. Patient has downloaded dexcom g6 and clarity app onto smartphone (iphone).   Patient reports Diabetes was diagnosed in 2019 (when she was aware)  Insurance coverage/medication affordability: Medicaid   Patient reports adherence with medications.  Current diabetes medications include: Insulin glargine (Lantus) 20 units daily, insulin aspart (Novolog) 7 units TID, Ozempic (semaglutdie) 0.25 mg weekly, Jardiance (empaglifloxin) 10 mg daily  Current hypertension medications include: lisinopril 10 mg daily Current hyperlipidemia medications include: atorvastatin 80 mg daily   Patient denies hypoglycemic events.  Patient reported dietary habits: Eats 3 meals/day - approximately the same size  Dinner: rice, tacos, pasta, potatoes, likes vegetables (states they are inconvenient), canned fruit  Drinks: diet soda, 1 gallon of water, occassionally alcohol   Patient-reported exercise habits: none    Patient reports nocturia about 2x per night  Patient denies neuropathy. Patient denies visual changes. Patient denies self foot exams. (follows with podiatrist)     Dexcom Information Email: [email or web address] Username: [email or web address] Password: [email or web address] #: [id number]   O:  Physical Exam Constitutional:      Appearance: Normal appearance.  Neurological:     Mental Status: She is alert.  Psychiatric:        Mood and Affect: Mood normal.        Behavior: Behavior normal.        Thought Content: Thought content normal.         Judgment: Judgment normal.    Review of Systems  All other systems reviewed and are negative.    Lab Results  Component Value Date   HGBA1C 9.6 (A) 09/06/2018   There were no vitals filed for this visit.  Lipid Panel     Component Value Date/Time   CHOL 137 09/06/2018 1628   TRIG 214 (H) 09/06/2018 1628   HDL 31 (L) 09/06/2018 1628   CHOLHDL 4.4 09/06/2018 1628   CHOLHDL 6.0 (H) 11/07/2015 1109   VLDL 63 (H) 11/07/2015 1109   LDLCALC 63 09/06/2018 1628   LDLDIRECT 145.1 12/05/2013 1626    Home fasting CBG: ~240s  2 hour post-prandial/random CBG: 200s Before bed (2 hour after dinner): 100-200   Clinical ASCVD: No    A/P: Diabetes longstanding since 2019, currently uncontrolled. Patient is adherent with medication. Control is suboptimal due to lifestyle habits. -Continuous blood glucose monitor (dexcom G6) was applied today on right quadrant of abdomen.  -Educated pt that sensors lasts 10 days and transmitter lasts 90 days. -Counseled pt thoroughly on application of sensor/transmitter, pairing transmitter with iphone, removal of sensor, utilizing alerts, and adding followers. -Informed pt how to generate sharing code on clarity app. -Discussed with pt if she is feeling hypoglycemic and reading on dexcom g6 does not match her s/sx of hypoglycemia then to check blood glucose reading via fingerstick. -Continue basal insulin Lantus (insulin glargine) 20 units daily -Increase rapid insulin Novolog (insulin aspart) from 7 units to 8 units if BG >200 before meals.  -Plan to increase dose of GLP-1 Ozempic (semaglutide) to 0.5 mg at next dose (01/13/19).  -  Extensively discussed pathophysiology of DM, recommended lifestyle interventions, dietary effects on glycemic control -Next A1C anticipated f/u office appt   ASCVD risk - primary prevention in patient with DM. Last LDL is controlled. Unable to calculate ASCVD risk score due to patient's age. Aspirin is not indicated.  -Consider discontinuing aspirin 81 mg daily at future appt -Continue atorvastatin 80 mg daily  Hypertension longstanding currently controlled.  BP goal = <130/80 mmHg. Patient is adherent with medication.  -Continue lisinopril 10 mg daily  Written patient instructions provided.  Total time in face to face counseling 60 minutes.   Follow up by phone with Pharmacist in 7-10 days.   Patient seen with Donald Prose, PharmD Candidate and Drexel Iha, PharmD PGY-2 Resident.

## 2019-01-06 NOTE — Patient Instructions (Addendum)
Good to see you today!  Switch your Dexcom sensor every 10 days. Switch your Dexcom transmitter every 90 days (3 months).  Contact us if you have any issues with your device.   Increase dose of Ozempic to 0.5 mg weekly next week (01/13/19). If blood sugar is greater than 200 before a meal, increase Novolog dose from 7 units to 8 units.  Contact us if your blood sugar is less than 70.

## 2019-01-06 NOTE — Assessment & Plan Note (Signed)
Diabetes longstanding since 2019, currently uncontrolled. Patient is adherent with medication. Control is suboptimal due to lifestyle habits. -Continuous blood glucose monitor (dexcom G6) was applied today on right quadrant of abdomen.  -Educated pt that sensors lasts 10 days and transmitter lasts 90 days. -Counseled pt thoroughly on application of sensor/transmitter, pairing transmitter with iphone, removal of sensor, utilizing alerts, and adding followers. -Informed pt how to generate sharing code on clarity app. -Discussed with pt if she is feeling hypoglycemic and reading on dexcom g6 does not match her s/sx of hypoglycemia then to check blood glucose reading via fingerstick. -Continue basal insulin Lantus (insulin glargine) 20 units daily -Increase rapid insulin Novolog (insulin aspart) from 7 units to 8 units if BG >200 before meals.  -Plan to increase dose of GLP-1 Ozempic (semaglutide) to 0.5 mg at next dose (01/13/19).  -Extensively discussed pathophysiology of DM, recommended lifestyle interventions, dietary effects on glycemic control -Next A1C anticipated f/u office appt

## 2019-01-07 NOTE — Progress Notes (Signed)
Reviewed: I agree with the documentation and management of Dr. Koval. 

## 2019-01-13 ENCOUNTER — Telehealth: Payer: Self-pay | Admitting: Pharmacist

## 2019-01-13 ENCOUNTER — Ambulatory Visit (INDEPENDENT_AMBULATORY_CARE_PROVIDER_SITE_OTHER): Payer: 59 | Admitting: Sports Medicine

## 2019-01-13 ENCOUNTER — Other Ambulatory Visit: Payer: Self-pay | Admitting: Sports Medicine

## 2019-01-13 ENCOUNTER — Other Ambulatory Visit: Payer: Self-pay

## 2019-01-13 ENCOUNTER — Encounter: Payer: Self-pay | Admitting: Sports Medicine

## 2019-01-13 ENCOUNTER — Ambulatory Visit (INDEPENDENT_AMBULATORY_CARE_PROVIDER_SITE_OTHER): Payer: 59

## 2019-01-13 DIAGNOSIS — E119 Type 2 diabetes mellitus without complications: Secondary | ICD-10-CM

## 2019-01-13 DIAGNOSIS — M79671 Pain in right foot: Secondary | ICD-10-CM

## 2019-01-13 DIAGNOSIS — M67471 Ganglion, right ankle and foot: Secondary | ICD-10-CM

## 2019-01-13 DIAGNOSIS — M67479 Ganglion, unspecified ankle and foot: Secondary | ICD-10-CM

## 2019-01-13 DIAGNOSIS — M79672 Pain in left foot: Secondary | ICD-10-CM

## 2019-01-13 NOTE — Assessment & Plan Note (Signed)
Pt's average glucose is 144 mg/dL and time in range is 92% (target range 70-180 mg/dL) for the past 7 days based on Dexcom Clarity report. Analyzed daily report for the past 7 days via telephone  BG readings (mg/dL): -Fasting: 100-150 -After breakfast: 100-150  -After lunch: 100-150 -After dinner: 150-200  Anticipate being able to determine more specific BG readings in the future once dexcom clarity is able to be installed on [facility] computer.   Plan to INCREASE Novolog from 7 units before each meal to Novolog 7 units before breakfast, 7 units before lunch, and 8 units before dinner. Continue Lantus 20 units daily in the morning, Jardiance 10 mg daily, and Ozempic 0.5 mg weekly (Thursdays).  Follow up with patient via telephone in 2 weeks (01/27/19).

## 2019-01-13 NOTE — Progress Notes (Signed)
Subjective: Holly Richards is a 38 y.o. female patient who presents to office for evaluation of lesion to the right second toe reports that has been there for several months does not recall any injury does not hurt does not tingle is not numb but does state that it is red to dark black with swelling but no drainage reports that she has not tried any treatment.  Patient is diabetic type II last blood sugar 140 and last A1c 9.  Last visit to PCP was on Thursday. Denies injury/trip/fall/sprain/any causative factors.  Review of Systems  All other systems reviewed and are negative.     Patient Active Problem List   Diagnosis Date Noted  . Mixed hyperlipidemia 09/06/2018  . Pink eye disease, left 06/23/2018  . Subclinical hypothyroidism 09/17/2015  . Liver enzyme elevation 09/17/2015  . Other fatigue 09/12/2015  . Type 2 diabetes mellitus without complication, without long-term current use of insulin ([place]) 09/12/2015  . Essential hypertension, benign 09/12/2015  . Headache 03/09/2015  . 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 Preventive measure 02/09/2013  . Elevated blood-pressure reading without diagnosis of hypertension 02/09/2013  . Abdominal pain, right lower quadrant 10/22/2012  . Hypothyroidism 10/15/2012  . Lumbar strain 05/06/2012  . Diaphoresis / heat intolerance 01/15/2012  . Bipolar disorder ([place])   . Anxiety   . 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 Obesity 03/05/2011  . Thyroid activity decreased 12/30/2010  . Celiac disease/sprue 11/21/2010    Current Outpatient Medications on File Prior to Visit  Medication Sig Dispense Refill  . aspirin EC 81 MG tablet Take 1 tablet (81 mg total) by mouth daily. 90 tablet 3  . atorvastatin (LIPITOR) 80 MG tablet Take 80 mg by mouth daily.    . Blood Glucose Monitoring Suppl (ONETOUCH VERIO) w/Device KIT 1 kit by Does not apply route daily. 1 kit 0  . Continuous Blood Gluc Receiver (DEXCOM G6 RECEIVER) DEVI 1 Product by Other route as directed.  1 Product 11  . dicyclomine (BENTYL) 20 MG tablet Take 20 mg by mouth every 6 (six) hours as needed.  0  . empagliflozin (JARDIANCE) 10 MG TABS tablet Take 10 mg by mouth daily. 90 tablet 1  . ezetimibe (ZETIA) 10 MG tablet Take 1 tablet (10 mg total) by mouth daily. 90 tablet 3  . insulin aspart (NOVOLOG FLEXPEN) 100 UNIT/ML FlexPen Inject 7 Units into the skin 3 (three) times daily before meals.    . Insulin Glargine (BASAGLAR KWIKPEN) 100 UNIT/ML SOPN Inject 0.2 mLs (20 Units total) into the skin daily.    . Insulin Pen Needle (PEN NEEDLES) 31G X 8 MM MISC TO BE USED WITH LANTUS PEN, ONCE A DAY. 100 each 1  . Lancet Device MISC Use with glucometer 1 each 0  . levothyroxine (SYNTHROID) 75 MCG tablet Take 1 tablet (75 mcg total) by mouth daily. 90 tablet 0  . lisinopril (ZESTRIL) 10 MG tablet TAKE 1 TABLET BY MOUTH EVERY DAY 90 tablet 0  . Multiple Vitamins-Minerals (MULTIVITAMIN PO) Take 1 tablet by mouth daily.    . Omega-3 Fatty Acids (FISH OIL) 435 MG CAPS Take 1 capsule (435 mg total) by mouth 2 (two) times daily. 60 each 1  . Semaglutide,0.25 or 0.5MG/DOS, (OZEMPIC, 0.25 OR 0.5 MG/DOSE,) 2 MG/1.5ML SOPN Inject 0.25 mg into the skin once a week. 3 pen 1   No current facility-administered medications on file prior to visit.     Allergies  Allergen Reactions  . Oseltamivir Phosphate Anaphylaxis  Objective:  General: Alert and oriented x3 in no acute distress  Dermatology: Raised soft tissue lesion noted to the dorsal IPJ of the right second toe consistent with cyst hemorrhagic in nature.  No open lesions bilateral lower extremities, no webspace macerations, no ecchymosis bilateral, all nails x 10 are well manicured.  Vascular: Dorsalis Pedis and Posterior Tibial pedal pulses palpable, Capillary Fill Time 3 seconds,(+) pedal hair growth bilateral, no edema bilateral lower extremities, Temperature gradient within normal limits.  Neurology: Gross sensation intact via light touch  bilateral, Protective sensation intact  with Semmes Weinstein Monofilament to all pedal sites, Position sense intact, vibratory intact bilateral, Deep tendon reflexes within normal limits bilateral, No babinski sign present bilateral. (- )Tinels sign bilateral.   Musculoskeletal: No tenderness or gross deformity noted to the right second toe.  Strength within normal limits in all groups bilateral.   Gait: Non-Antalgic gait  Xrays  Right foot   Impression: Mild soft tissue swelling at right second toe over the interphalangeal joint no other acute findings.  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  Advised patient to continue with dressings consisting of the same for 1 week to help be cyst to keep from rising back up again. -Advised patient to return to office or to notify us if the area appears to be infected -Educated patient on nature of cyst and probability of recurrence -Encouraged daily foot inspection in setting of diabetes and good glycemic control -Patient to return to office as needed or sooner if condition worsens.  Landis Martins, DPM

## 2019-01-13 NOTE — Telephone Encounter (Signed)
Called pt on 01/13/19 at 1:52 PM.  Pt reports her BG reading are staying below 200 mg/dL "most of time". Pt confirms she is taking Lantus 20 units daily in the morning and Novolog 7 units before each meal. Pt has not titrated Novolog to 8 units because she has not noticed BG readings > 200 mg/dL before meals. Also, pt confirms she increased Ozempic 0.5 mg weekly (Thursdays) today (01/13/19). She continues to take Jardiance 10 mg daily. She is tolerating her DM regimen and not experiencing side effects. She is not experiencing any episodes of hypoglycemia.   Pt's average glucose is 144 mg/dL and time in range is 92% (target range 70-180 mg/dL) for the past 7 days based on Dexcom Clarity report. Analyzed daily report for the past 7 days via telephone  BG readings (mg/dL): -Fasting: 100-150 -After breakfast: 100-150  -After lunch: 100-150 -After dinner: 150-200  Anticipate being able to determine more specific BG readings in the future once dexcom clarity is able to be installed on [facility] computer.   Plan to INCREASE Novolog from 7 units before each meal to Novolog 7 units before breakfast, 7 units before lunch, and 8 units before dinner. Continue Lantus 20 units daily in the morning, Jardiance 10 mg daily, and Ozempic 0.5 mg weekly (Thursdays).  Follow up with patient via telephone in 2 weeks (01/27/19).

## 2019-01-14 ENCOUNTER — Other Ambulatory Visit: Payer: Self-pay

## 2019-01-14 DIAGNOSIS — E119 Type 2 diabetes mellitus without complications: Secondary | ICD-10-CM

## 2019-01-15 MED ORDER — BASAGLAR KWIKPEN 100 UNIT/ML ~~LOC~~ SOPN: 20.0000 [IU] | PEN_INJECTOR | Freq: Every day | SUBCUTANEOUS | Status: DC

## 2019-01-28 ENCOUNTER — Telehealth: Payer: Self-pay | Admitting: Pharmacist

## 2019-01-28 DIAGNOSIS — E119 Type 2 diabetes mellitus without complications: Secondary | ICD-10-CM

## 2019-01-28 NOTE — Telephone Encounter (Signed)
Called pt on 01/28/2019 at 3:00PM and left HIPAA-compliant VM with instructions to call Family Medicine clinic back   Dexcom Clarity Sharing code: [id number]  Able to access dexcom clarity report of blood sugar readings.   Dexcom Clarity Report Average glucose reading: 145 mg/dL  Very low (<54 mg/dL): 0.0% Low (<70 mg/dL): 0.0% In target range (70-180 mg/dL): 91.7% High (>180 mg/dL): 8.3% Very High (>150 mg/dL): 0.0%  Pt's BG readings have improved significantly since using Dexcom G6 continuous blood glucose monitor system. Plan to inform patient we are able to assess Dexcom G6 clarity report and encourage patient for her success in managing BG.  Plan to re-assess if patient is tolerating Ozempic increase from 0.25 mg weekly to 0.5 mg weekly (first 0.5 mg dose on 01/13/2019). Continue Novolog 7 units before breakfast, 7 units before lunch, and 8 units before dinner. Continue Lantus 20 units daily in the morning, Jardiance 10 mg daily, and Ozempic 0.5 mg weekly (Thursdays). If pt is tolerating Ozempic, instruct patient to increase Ozempic from 0.5 mg weekly to 1 mg weekly on 02/10/2019.   Drexel Iha, PharmD PGY2 Ambulatory Care Pharmacy Resident

## 2019-01-29 ENCOUNTER — Other Ambulatory Visit: Payer: Self-pay | Admitting: Family Medicine

## 2019-01-29 DIAGNOSIS — E039 Hypothyroidism, unspecified: Secondary | ICD-10-CM

## 2019-01-31 NOTE — Telephone Encounter (Signed)
Called and spoke with patient on 01/31/2019.  Discussed with pt her blood glucose readings reported from [facility] Clarity report. Pt's BG are within range of 70-180 mg/dL 92% of the time within prior 30 days. Her fasting blood glucose tended to range between 99-160 (average ~140s). Her blood glucose increases the most after dinner (2 hr post prandial), which ranges between 127-187 (average ~160s) She has not experienced any low blood glucose readings or any s/sx of hypotension. Encouraged pt for her success in managing blood glucose readings!!!  Clarified with pt that she started Ozempic 0.25 mg on 12/23/2018 and she increased to Ozempic 0.5 on 01/20/2019 (not 01/13/2019 which was documented in prior note). Patient administers Ozempic on Thursdays Plan for patient to increase to Ozempic 1 mg on 02/17/2019 since fasting blood glucose levels remain elevated and pt is tolerating Ozempic dosing. Instructed pt to contact Family Medicine clinic and ask to speak with a pharmacist if she experiences any side effects (GI upset) or if she experiences any episodes of hypoglycemia. Continue Novolog 7 units with breakfast/7 units with lunch/8 units with dinner, Lantus 20 units daily in the morning, Jardiance 10 mg daily.  Pt verbalized understanding.  Will follow up with patient on 03/03/2019.

## 2019-02-03 MED ORDER — BASAGLAR KWIKPEN 100 UNIT/ML ~~LOC~~ SOPN: 18.0000 [IU] | PEN_INJECTOR | Freq: Every day | SUBCUTANEOUS | Status: DC

## 2019-02-03 MED ORDER — SEMAGLUTIDE (1 MG/DOSE) 2 MG/1.5ML ~~LOC~~ SOPN: 1.0000 mg | PEN_INJECTOR | SUBCUTANEOUS | 3 refills | Status: DC

## 2019-02-03 NOTE — Telephone Encounter (Signed)
Noted and agree. 

## 2019-02-03 NOTE — Telephone Encounter (Signed)
Called pt on 02/03/2019 at 1:04 PM.   After discussion with Dr. Valentina Lucks, decided it would be optimal to decrease Lantus from 20 units daily to 18 units daily. Continue Novolog 7 units with breakfast/7 units with lunch/8 units with dinner, Ozempic 1 mg weekly, and 1 Jardiance 10 mg daily. Educated pt to call pharmacist at [facility] if she experiences hypoglycemia. Pt verbalized understanding.  Will follow up 03/03/2019.

## 2019-02-03 NOTE — Addendum Note (Signed)
Addended by: Leavy Cella on: 02/03/2019 02:50 PM   Modules accepted: Orders

## 2019-02-12 DIAGNOSIS — H40013 Open angle with borderline findings, low risk, bilateral: Secondary | ICD-10-CM | POA: Insufficient documentation

## 2019-03-03 ENCOUNTER — Telehealth: Payer: Self-pay | Admitting: Pharmacist

## 2019-03-03 NOTE — Telephone Encounter (Signed)
Called patient on 03/03/2019 at 12:40 PM.  Assessed Dexcom Clarity report via  [facility] code.  Dexcom G6 Report (02/17/2019 - 03/03/2019) Average Glucose: 175 mg/dL Standard Deviation: 40 mg/dL Glucose Management Indicator (GMI): 7.3%  Time in Range: 61%  -Very high (>/= 250): 6% -High (181-249): 33% -In range (71-180): 61% -Low (56-70): 0% -Very low (</= 55): 0%  Low Events: None  Prior Dexcom G6 report (02/02/2019 - 02/11/2019) showed a time in range of 73%. Her high readings were 26% of the time and very high <1% of the time. It appears patient's BG have increased since last Dexcom report. After assessing daily report, it appears that BG has increased significantly after dinner / nighttime. Asked patient if she has increased her carb intake during this timeframe. Patient states it is possible. She is not consistent with her diet. Advised patient to try to decrease carb intake at night. Patient is tolerating increased dose of Ozempic 1 mg daily (first injection of 1 mg dose was on 02/17/2019) and is not experiencing side effects. Plan for patient to increase Novolog from 7-7-8 to 09-20-08. Plan to continue Lantus 18 units daily, Ozempic 1 mg weekly (Thursdays), and empagliflozin 10 mg daily. Patient verbalized understanding. Follow up with patient on 03/31/2019 at 3:30 PM via office-appt with pharmacy team to re-assess DM management and obtain an updated HbA1c.

## 2019-03-16 ENCOUNTER — Other Ambulatory Visit: Payer: Self-pay | Admitting: Family Medicine

## 2019-03-16 ENCOUNTER — Other Ambulatory Visit: Payer: Self-pay | Admitting: *Deleted

## 2019-03-16 DIAGNOSIS — I10 Essential (primary) hypertension: Secondary | ICD-10-CM

## 2019-03-16 MED ORDER — LISINOPRIL 10 MG PO TABS: 10.0000 mg | ORAL_TABLET | Freq: Every day | ORAL | 0 refills | Status: DC

## 2019-03-16 NOTE — Telephone Encounter (Signed)
Patient has an upcoming appointment with Dr. Valentina Lucks to discuss her diabetes, can you place a future order for her bmp to be drawn at that time?  Jarika Robben,CMA

## 2019-03-16 NOTE — Telephone Encounter (Signed)
We do not have a BMP for this patient since March 2019, so she will need to make an appointment to have this done.  I will refill 90 days of her lisinopril, but she should get a BMP before further refills.  Thanks.

## 2019-03-16 NOTE — Telephone Encounter (Signed)
Order placed.  Thanks Jazmin!

## 2019-03-31 ENCOUNTER — Ambulatory Visit: Payer: 59 | Admitting: Pharmacist

## 2019-04-04 ENCOUNTER — Telehealth: Payer: Self-pay | Admitting: Pharmacist

## 2019-04-04 ENCOUNTER — Other Ambulatory Visit: Payer: Self-pay | Admitting: Pharmacist

## 2019-04-04 DIAGNOSIS — E119 Type 2 diabetes mellitus without complications: Secondary | ICD-10-CM

## 2019-04-04 MED ORDER — DEXCOM G6 TRANSMITTER MISC: 1.0000 | 3 refills | Status: DC

## 2019-04-04 MED ORDER — DEXCOM G6 SENSOR MISC: 1.0000 | 11 refills | Status: DC

## 2019-04-04 NOTE — Telephone Encounter (Signed)
Called patient on 04/04/2019 at 11:21 AM  Dexcom G6 Report(03/06/2019 - 04/04/2019) Average Glucose: 176 mg/dL Standard Deviation: 41 mg/dL Glucose Management Indicator (GMI): 7.5%  Time in Range: -Very high (>250 mg/dL): 5% -High (181-250 mg/dL): 35% -In range (70-180 mg/dL): 60% -Low (54-69 mg/dL): 0% -Very low (<54 mg/dL): 0%  Dexcom G6 Report (02/17/2019 - 03/03/2019) Average Glucose: 175 mg/dL Standard Deviation: 40 mg/dL Glucose Management Indicator (GMI): 7.3%  Time in Range: 61%  -Very high (>/= 250): 6% -High (181-249): 33% -In range (71-180): 61% -Low (56-70): 0% -Very low (</= 55): 0%  Patient remains tolerating current DM regimen - Ozempic 1 mg subQ weekly, Basaglar 18 units daily, and Novolog 09-20-08 units. She reports adherence to IT consultant. She has been forgetting to take her Novolog frequently (thinks she skipped completely on 1/16 and on average has been forgetting to take it about once daily for the past two weeks (attributes this to working more and eating more fast food on the run (works 6:30AM - 3PM at one job and 7PM-11PM at second job).  It appears patient's BG have slightly increased since prior Dexcom G6 report. After speaking with patient it does not appear that most current report is truly reflective of her DM management. However, important to note - patient has not been experiencing any hypoglycemia.   She has been eating poorly (specifically more fast food), stressed with work, and forgetting to administer Novolog with meals from 03/31/2019 - 04/04/2019 and 03/26/2019 - 03/29/2019 which is reflected in her Dexcom G6 clarity report considering her BG readings were consistently > 200 mg/dL. It is evident via Clarity report patient had issues with connection through 03/29/19 - 03/31/19.  BG readings were in range of 70-180 mg/dL most of the time within 03/21/2019 - 03/25/2019. Patient attributes this to helping her parents move (inc in exercise), eating healthier  (eating at home), and adherence to Novolog (able to set insulin pen out at kitchen table with meals).  She had problems with connection of sensor/transmitter to iPhone from 03/13/20 - 03/19/19. Patient does not remember this occurring. It is possible this is reflective of a bad sensor.  Encouraged patient for being honest about adherence and lifestyle. Discussed with her since she plans on working more she will have to adjust her lifestyle and how it may be helpful to set alarms/reminders on phone to administer Novolog. Patient plans on setting 2 alarms/reminders with each meal (in case she forgets after 1st alarm) to administer Novolog. Patient is going to focus on eating healthier as well - specifically less fast food.  Patient will be seen by PCP Dr. Pilar Plate tomorrow and will be able to have updated HbA1c drawn then.   Patient requests refills for Dexcom G6 CGM sensors and transmitters. Refills sent to [facility].   Plan for patient to continue current DM regimen (Ozempic 1 mg subQ weekly, Basaglar 18 units daily, and Novolog 09-20-08 units), focus on adherence to Novolog (setting 2 alarms/reminders with each meal), and implementing healthier eating habits. Follow up with patient in 2 weeks via telephone to re-assess DM management.   Thank you for involving pharmacy to assist in providing Ms. Hyams's care.   Drexel Iha, PharmD PGY2 Ambulatory Care Pharmacy Resident

## 2019-04-04 NOTE — Progress Notes (Signed)
Error

## 2019-04-05 ENCOUNTER — Ambulatory Visit (INDEPENDENT_AMBULATORY_CARE_PROVIDER_SITE_OTHER): Payer: No Typology Code available for payment source | Admitting: Family Medicine

## 2019-04-05 ENCOUNTER — Telehealth: Payer: Self-pay | Admitting: Family Medicine

## 2019-04-05 ENCOUNTER — Other Ambulatory Visit: Payer: Self-pay

## 2019-04-05 ENCOUNTER — Encounter: Payer: Self-pay | Admitting: Family Medicine

## 2019-04-05 ENCOUNTER — Other Ambulatory Visit (HOSPITAL_COMMUNITY)
Admission: RE | Admit: 2019-04-05 | Discharge: 2019-04-05 | Disposition: A | Discharge status: Home / Self Care | Payer: No Typology Code available for payment source | Source: Ambulatory Visit | Attending: Family Medicine | Admitting: Family Medicine

## 2019-04-05 VITALS — BP 120/72 | HR 80 | Ht 62.0 in | Wt 205.3 lb

## 2019-04-05 DIAGNOSIS — E119 Type 2 diabetes mellitus without complications: Secondary | ICD-10-CM | POA: Diagnosis not present

## 2019-04-05 DIAGNOSIS — Z23 Encounter for immunization: Secondary | ICD-10-CM

## 2019-04-05 DIAGNOSIS — Z124 Encounter for screening for malignant neoplasm of cervix: Secondary | ICD-10-CM | POA: Insufficient documentation

## 2019-04-05 DIAGNOSIS — Q969 Turner's syndrome, unspecified: Secondary | ICD-10-CM

## 2019-04-05 DIAGNOSIS — N952 Postmenopausal atrophic vaginitis: Secondary | ICD-10-CM | POA: Insufficient documentation

## 2019-04-05 DIAGNOSIS — E039 Hypothyroidism, unspecified: Secondary | ICD-10-CM

## 2019-04-05 DIAGNOSIS — E782 Mixed hyperlipidemia: Secondary | ICD-10-CM

## 2019-04-05 DIAGNOSIS — I10 Essential (primary) hypertension: Secondary | ICD-10-CM

## 2019-04-05 LAB — POCT GLYCOSYLATED HEMOGLOBIN (HGB A1C): HbA1c, POC (controlled diabetic range): 8 % — AB (ref 0.0–7.0)

## 2019-04-05 MED ORDER — ESTROGENS, CONJUGATED 0.625 MG/GM VA CREA: 1.0000 | TOPICAL_CREAM | Freq: Every day | VAGINAL | 0 refills | Status: DC

## 2019-04-05 NOTE — Addendum Note (Signed)
Addended by: Grant Ruts on: 04/05/2019 08:45 PM   Modules accepted: Orders

## 2019-04-05 NOTE — Assessment & Plan Note (Signed)
Stable. -Continue lisinopril 10 mg daily -Follow-up BMP

## 2019-04-05 NOTE — Assessment & Plan Note (Signed)
-  Follow-up TSH -Continue Synthroid 75 mcg daily

## 2019-04-05 NOTE — Patient Instructions (Signed)
Is a quick summary of the things we talked about today:  Pap smear: We will be in touch with you regarding the results.  This will probably take about a week.  Atrophic vaginitis: This is probably the cause of your vaginal irritation.  Lets try vaginal estrogen cream to see if it is helpful.  Apply this daily at the same time every day.  Diabetes: Looks like your A1c is improved today compared to your last measurement.  Continue with your current medication regimen for now.  Hypothyroidism: We will check your TSH today to make sure that it is within an appropriate level.  Foster parent paperwork: Let me know if you have any trouble with the paperwork we filled out today.

## 2019-04-05 NOTE — Progress Notes (Addendum)
Subjective:  Holly Richards is a 39 y.o. female who presents to the [facility] today for a physical exam.   HPI:  Diabetes, type II In addition to the nutrition and exercise, her current diabetic regimen includes empagliflozin 10 mg daily, semaglutide 1 injection weekly, Lantus 18 units each morning, aspart 7 units at breakfast, 7 at lunch and 10 units at dinner.  She reports that she has been to the ophthalmologist in the past year for diabetic eye exam which was unremarkable.  HLD Her current regimen for her mixed hyperlipidemia includes atorvastatin 80 mg, ezetimibe 10 mg daily, omega-3 fatty acids daily.  Hypothyroidism Her current medication includes Synthroid 75 micrograms.  She does not reporting any symptoms at this time.  Hypertension Her current medication includes lisinopril 10 mg daily.  No complaints with current medication.  Aspirin, primary prevention She reports that she was put on aspirin for primary prevention about 1 year ago.  She denies any history of stroke or heart damage.  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  She reports that she was told that she experienced menopause in her teens.  She does not currently experience any symptoms of hot flashes but she does experience irritating vaginal dryness.  She is not currently on hormone replacement therapy.  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  She reports having followed with the pediatric endocrinologist she received growth hormone during her adolescence but has not seen that physician for years.  She is not currently followed for any cardiac monitoring.  Screening for cervical cancer She is due for her Pap smear.  She is not currently experiencing any vaginal bleeding or discharge although she is experiencing some discomfort from vaginal dryness.  Desire to foster  children She has brought a form with her to clinic today which is required for her and her husband to apply to foster parents.  The forms primarily interested in knowing if she would have any medical conditions that would preclude her from taking part in the care of foster children.  Objective:  Physical Exam: BP 120/72   Pulse 80   Ht 5\' 2"  (1.575 m)   Wt 205 lb 5 oz (93.1 kg)   SpO2 99%   BMI 37.55 kg/m    General: Appears older than stated age.  Obese woman in no acute distress.  She comfortably in the exam table. Pelvic exam: Normal external genitalia.  Significant pain with insertion of the speculum.  Mildly erythematous and dry vaginal mucosa.  Speculum exchanged for a smaller size.  Persisten discomfort with insertion of the smaller speculum although more bearable.  Cervix visualized and Pap smear sample taken.  No evidence of secretions or pooling in the posterior fornix.  Diabetic Foot Exam - Simple   Simple Foot Form Diabetic Foot exam was performed with the following findings: Yes 04/05/2019  8:18 PM  Visual Inspection No deformities, no ulcerations, no other skin breakdown bilaterally: Yes Sensation Testing Intact to touch and monofilament testing bilaterally: Yes Pulse Check Posterior Tibialis and Dorsalis pulse intact bilaterally: Yes Comments      Results for orders placed or performed in visit on 04/05/19 (from the past 72 hour(s))  HgB A1c     Status: Abnormal   Collection Time: 04/05/19  4:30 PM  Result Value Ref Range   Hemoglobin A1C     HbA1c POC (<> result,  manual entry)     HbA1c, POC (prediabetic range)     HbA1c, POC (controlled diabetic range) 8.0 (A) 0.0 - 7.0 %     Assessment/Plan:  Essential hypertension, benign Stable. -Continue lisinopril 10 mg daily -Follow-up BMP  Type 2 diabetes mellitus without complication, without long-term current use of insulin (HCC) A1c down to 8.0 from 9.6.  She was congratulated on the significant improvement.   We will continue with current diabetes management.  Foot exam performed today, normal. -Continue empagliflozin 10 mg daily -Continue semaglutide injections weekly -Lantus 18 units daily -Aspart 7 units with breakfast and lunch, 10 units with dinner -Follow-up in 3 months  Hypothyroidism -Follow-up TSH -Continue Synthroid 75 mcg daily  Mixed hyperlipidemia Lipid panel noted within the past year. -Continue atorvastatin 80 mg daily -Continue ezetimibe 10 mg daily -Continue omega-3 fatty acids daily  Screening for cervical cancer -Follow-up Pap smear  Atrophic vaginitis History and physical exam consistent with atrophic vaginitis.  Early in a 39 year old female although consistent with Turner syndrome.  We will start vaginal applications of estrogen cream for now. -Estrogen cream sent to pharmacy.  Turner syndrome Based on UpToDate recommendations, Holly Richards may benefit from estrogen replacement therapy until she is 39 years old.  This seems to be a delicate balance in a young woman who would be at increased risk for endometrial cancer with prolonged exposure to estrogen therapy.  Will refer to endocrine for further recommendations and treatment. -Ambulatory referral to endocrinology placed  She would also benefit from routine visits with a cardiologist.  She is not currently monitored by cardiology. -Ambulatory referral to cardiology placed  Aspirin for primary prevention Based on most recent [employer] recommendations, this is no longer necessary.   - aspirin discontinued  [name] parent forms Mrs. Feuer is at low risk for exposure and acquisition of tuberculosis.  No need for screening at this time. [name] parent paperwork filled out

## 2019-04-05 NOTE — Assessment & Plan Note (Signed)
Lipid panel noted within the past year. -Continue atorvastatin 80 mg daily -Continue ezetimibe 10 mg daily -Continue omega-3 fatty acids daily

## 2019-04-05 NOTE — Assessment & Plan Note (Signed)
A1c down to 8.0 from 9.6.  She was congratulated on the significant improvement.  We will continue with current diabetes management.  Foot exam performed today, normal. -Continue empagliflozin 10 mg daily -Continue semaglutide injections weekly -Lantus 18 units daily -Aspart 7 units with breakfast and lunch, 10 units with dinner -Follow-up in 3 months

## 2019-04-05 NOTE — Assessment & Plan Note (Signed)
-  Follow-up Pap smear

## 2019-04-05 NOTE — Assessment & Plan Note (Addendum)
Based on UpToDate recommendations, Mrs. Bonville may benefit from estrogen replacement therapy until she is 39 years old.  This seems to be a delicate balance in a young woman who would be at increased risk for endometrial cancer with prolonged exposure to estrogen therapy.  Will refer to endocrine for further recommendations and treatment. -Ambulatory referral to endocrinology placed  She would also benefit from routine visits with a cardiologist.  She is not currently monitored by cardiology. -Ambulatory referral to cardiology placed

## 2019-04-05 NOTE — Progress Notes (Signed)
a1c

## 2019-04-05 NOTE — Assessment & Plan Note (Signed)
History and physical exam consistent with atrophic vaginitis.  Early in a 39 year old female although consistent with Turner syndrome.  We will start vaginal applications of estrogen cream for now. -Estrogen cream sent to pharmacy.

## 2019-04-06 ENCOUNTER — Ambulatory Visit (INDEPENDENT_AMBULATORY_CARE_PROVIDER_SITE_OTHER): Payer: No Typology Code available for payment source | Admitting: Sports Medicine

## 2019-04-06 ENCOUNTER — Other Ambulatory Visit: Payer: Self-pay | Admitting: Family Medicine

## 2019-04-06 ENCOUNTER — Encounter: Payer: Self-pay | Admitting: Sports Medicine

## 2019-04-06 ENCOUNTER — Other Ambulatory Visit: Payer: Self-pay

## 2019-04-06 DIAGNOSIS — M79671 Pain in right foot: Secondary | ICD-10-CM

## 2019-04-06 DIAGNOSIS — E119 Type 2 diabetes mellitus without complications: Secondary | ICD-10-CM | POA: Diagnosis not present

## 2019-04-06 DIAGNOSIS — M79672 Pain in left foot: Secondary | ICD-10-CM

## 2019-04-06 DIAGNOSIS — M67471 Ganglion, right ankle and foot: Secondary | ICD-10-CM

## 2019-04-06 DIAGNOSIS — D2372 Other benign neoplasm of skin of left lower limb, including hip: Secondary | ICD-10-CM | POA: Diagnosis not present

## 2019-04-06 DIAGNOSIS — Z87798 Personal history of other (corrected) congenital malformations: Secondary | ICD-10-CM

## 2019-04-06 LAB — BASIC METABOLIC PANEL
BUN/Creatinine Ratio: 15 (ref 9–23)
BUN: 8 mg/dL (ref 6–20)
CO2: 24 mmol/L (ref 20–29)
Calcium: 9.6 mg/dL (ref 8.7–10.2)
Chloride: 102 mmol/L (ref 96–106)
Creatinine, Ser: 0.54 mg/dL — ABNORMAL LOW (ref 0.57–1.00)
GFR calc Af Amer: 139 mL/min/{1.73_m2} (ref >59)
GFR calc non Af Amer: 121 mL/min/{1.73_m2} (ref >59)
Glucose: 150 mg/dL — ABNORMAL HIGH (ref 65–99)
Potassium: 4.2 mmol/L (ref 3.5–5.2)
Sodium: 143 mmol/L (ref 134–144)

## 2019-04-06 LAB — TSH: TSH: 9.95 u[IU]/mL — ABNORMAL HIGH (ref 0.450–4.500)

## 2019-04-06 MED ORDER — ESTRADIOL 0.1 MG/GM VA CREA: 1.0000 | TOPICAL_CREAM | Freq: Every day | VAGINAL | 2 refills | Status: DC

## 2019-04-06 MED ORDER — LEVOTHYROXINE SODIUM 88 MCG PO TABS: 88.0000 ug | ORAL_TABLET | ORAL | 0 refills | Status: DC

## 2019-04-06 NOTE — Patient Instructions (Signed)
Pre-Operative Instructions  Congratulations, you have decided to take an important step towards improving your quality of life.  You can be assured that the doctors and staff at [facility] will be with you every step of the way.  Here are some important things you should know:  1. Plan to be at the surgery center/hospital at least 1 (one) hour prior to your scheduled time, unless otherwise directed by the surgical center/hospital staff.  You must have a responsible adult accompany you, remain during the surgery and drive you home.  Make sure you have directions to the surgical center/hospital to ensure you arrive on time. 2. If you are having surgery at [facility] or [facility], you will need a copy of your medical history and physical form from your family physician within one month prior to the date of surgery. We will give you a form for your primary physician to complete.  3. We make every effort to accommodate the date you request for surgery.  However, there are times where surgery dates or times have to be moved.  We will contact you as soon as possible if a change in schedule is required.   4. No aspirin/ibuprofen for one week before surgery.  If you are on aspirin, any non-steroidal anti-inflammatory medications (Mobic, Aleve, Ibuprofen) should not be taken seven (7) days prior to your surgery.  You make take Tylenol for pain prior to surgery.  5. Medications - If you are taking daily heart and blood pressure medications, seizure, reflux, allergy, asthma, anxiety, pain or diabetes medications, make sure you notify the surgery center/hospital before the day of surgery so they can tell you which medications you should take or avoid the day of surgery. 6. No food or drink after midnight the night before surgery unless directed otherwise by surgical center/hospital staff. 7. No alcoholic beverages 24-hours prior to surgery.  No smoking 24-hours prior or 24-hours after  surgery. 8. Wear loose pants or shorts. They should be loose enough to fit over bandages, boots, and casts. 9. Don't wear slip-on shoes. Sneakers are preferred. 10. Bring your boot with you to the surgery center/hospital.  Also bring crutches or a walker if your physician has prescribed it for you.  If you do not have this equipment, it will be provided for you after surgery. 11. If you have not been contacted by the surgery center/hospital by the day before your surgery, call to confirm the date and time of your surgery. 12. Leave-time from work may vary depending on the type of surgery you have.  Appropriate arrangements should be made prior to surgery with your employer. 13. Prescriptions will be provided immediately following surgery by your doctor.  Fill these as soon as possible after surgery and take the medication as directed. Pain medications will not be refilled on weekends and must be approved by the doctor. 14. Remove nail polish on the operative foot and avoid getting pedicures prior to surgery. 15. Wash the night before surgery.  The night before surgery wash the foot and leg well with water and the antibacterial soap provided. Be sure to pay special attention to beneath the toenails and in between the toes.  Wash for at least three (3) minutes. Rinse thoroughly with water and dry well with a towel.  Perform this wash unless told not to do so by your physician.  Enclosed: 1 Ice pack (please put in freezer the night before surgery)   1 Hibiclens skin cleaner     Pre-op instructions  If you have any questions regarding the instructions, please do not hesitate to call our office.  [place]: [address], [place], [place] [address] -- [phone]  [place]: [address] [place], [place] [address] -- [phone]  [place]: [address], [place], [place] [address] -- [phone]   Website: [email or web address] 

## 2019-04-06 NOTE — Progress Notes (Signed)
Called and informed the TSH in elevated. Will increase levothyroxine to 88 micrograms daily.  Recheck in 3 months at DM follow up.

## 2019-04-06 NOTE — Progress Notes (Signed)
Subjective: Holly Richards is a 39 y.o. female patient who returns to office for follow-up evaluation of cyst at right second toe reports that after a few days it was last drained the cyst slowly reappeared reports that there is a little pain to the area but wants it completely gone patient also admits that she has a small dark skin lesion to the interspace of the left fourth toe that she wants me to check.  Patient denies any pain to the left fourth toe or any redness warmth or swelling.  Patient is diabetic type II last blood sugar 200 and last A1c 8 and is down from before which was 9.  Reports that she will go see her cardiologist on tomorrow for follow-up evaluation has a history of Turner syndrome.   No other issues noted.  Patient Active Problem List   Diagnosis Date Noted  . Screening for cervical cancer 04/05/2019  . Atrophic vaginitis 04/05/2019  . Mixed hyperlipidemia 09/06/2018  . Pink eye disease, left 06/23/2018  . Subclinical hypothyroidism 09/17/2015  . Liver enzyme elevation 09/17/2015  . Other fatigue 09/12/2015  . Type 2 diabetes mellitus without complication, without long-term current use of insulin ([place]) 09/12/2015  . Essential hypertension, benign 09/12/2015  . Headache 03/09/2015  . 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 Preventive measure 02/09/2013  . Elevated blood-pressure reading without diagnosis of hypertension 02/09/2013  . Abdominal pain, right lower quadrant 10/22/2012  . Hypothyroidism 10/15/2012  . Lumbar strain 05/06/2012  . Diaphoresis / heat intolerance 01/15/2012  . Bipolar disorder ([place])   . Anxiety   . 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 Obesity 03/05/2011  . Thyroid activity decreased 12/30/2010  . Celiac disease/sprue 11/21/2010    Current Outpatient Medications on File Prior to Visit  Medication Sig Dispense Refill  . atorvastatin (LIPITOR) 80 MG tablet Take 80 mg by mouth daily.    . Blood Glucose Monitoring Suppl (ONETOUCH VERIO) w/Device KIT  1 kit by Does not apply route daily. 1 kit 0  . Continuous Blood Gluc Receiver (DEXCOM G6 RECEIVER) DEVI 1 Product by Other route as directed. 1 Product 11  . Continuous Blood Gluc Sensor (DEXCOM G6 SENSOR) MISC Inject 1 applicator into the skin as directed. Change sensor every 10 days. 3 each 11  . Continuous Blood Gluc Transmit (DEXCOM G6 TRANSMITTER) MISC Inject 1 Device into the skin as directed. Change transmitter every 90 days (will reuse 9 times) 1 each 3  . dicyclomine (BENTYL) 20 MG tablet Take 20 mg by mouth every 6 (six) hours as needed.  0  . empagliflozin (JARDIANCE) 10 MG TABS tablet Take 10 mg by mouth daily. 90 tablet 1  . ezetimibe (ZETIA) 10 MG tablet Take 1 tablet (10 mg total) by mouth daily. 90 tablet 3  . insulin aspart (NOVOLOG FLEXPEN) 100 UNIT/ML FlexPen Inject 7 Units into the skin 3 (three) times daily before meals.    . Insulin Glargine (BASAGLAR KWIKPEN) 100 UNIT/ML SOPN Inject 0.18 mLs (18 Units total) into the skin daily.    . Insulin Pen Needle (PEN NEEDLES) 31G X 8 MM MISC TO BE USED WITH LANTUS PEN, ONCE A DAY. 100 each 1  . Lancet Device MISC Use with glucometer 1 each 0  . lisinopril (ZESTRIL) 10 MG tablet Take 1 tablet (10 mg total) by mouth daily. 90 tablet 0  . Multiple Vitamins-Minerals (MULTIVITAMIN PO) Take 1 tablet by mouth daily.    . Omega-3 Fatty Acids (FISH OIL) 435 MG CAPS  Take 1 capsule (435 mg total) by mouth 2 (two) times daily. 60 each 1  . Semaglutide, 1 MG/DOSE, 2 MG/1.5ML SOPN Inject 1 mg into the skin once a week. 2 pen 3   No current facility-administered medications on file prior to visit.    Allergies  Allergen Reactions  . Oseltamivir Phosphate Anaphylaxis   Family History  Problem Relation Age of Onset  . Autoimmune disease Mother   . Alcohol abuse Father   . Hyperlipidemia Father   . Hypertension Father   . Amenorrhea Brother   . Diabetes Maternal Grandmother   . Diabetes Maternal Grandfather     Social History    Socioeconomic History  . Marital status: Married    Spouse name: Not on file  . Number of children: Not on file  . Years of education: Not on file  . Highest education level: Not on file  Occupational History  . Not on file  Tobacco Use  . Smoking status: Never Smoker  . Smokeless tobacco: Never Used  Substance and Sexual Activity  . Alcohol use: Not on file  . Drug use: Not on file  . Sexual activity: Not on file  Other Topics Concern  . Not on file  Social History Narrative  . Not on file   Social Determinants of Health   Financial Resource Strain:   . Difficulty of Paying Living Expenses: Not on file  Food Insecurity:   . Worried About Charity fundraiser in the Last Year: Not on file  . Ran Out of Food in the Last Year: Not on file  Transportation Needs:   . Lack of Transportation (Medical): Not on file  . Lack of Transportation (Non-Medical): Not on file  Physical Activity:   . Days of Exercise per Week: Not on file  . Minutes of Exercise per Session: Not on file  Stress:   . Feeling of Stress : Not on file  Social Connections:   . Frequency of Communication with Friends and Family: Not on file  . Frequency of Social Gatherings with Friends and Family: Not on file  . Attends Religious Services: Not on file  . Active Member of Clubs or Organizations: Not on file  . Attends Archivist Meetings: Not on file  . Marital Status: Not on file   Past Surgical History:  Procedure Laterality Date  . CHOLECYSTECTOMY    . COLONOSCOPY    . CORNEAL TRANSPLANT     Objective:  General: Alert and oriented x3 in no acute distress  Dermatology: Raised soft tissue lesion noted to the dorsal DIPJ of the right second toe consistent with cyst hemorrhagic in nature.  There is a small pigmented lesion noted to the medial aspect of the left fourth toe that measures less than 0.3 cm consistent with neoplasm, no open lesions bilateral lower extremities, no webspace  macerations, no ecchymosis bilateral, all nails x 10 are well manicured.  Vascular: Dorsalis Pedis and Posterior Tibial pedal pulses palpable, Capillary Fill Time 3 seconds,(+) pedal hair growth bilateral, no edema bilateral lower extremities, Temperature gradient within normal limits.  Neurology: Johney Maine sensation intact via light touch bilateral.   Musculoskeletal: No tenderness or gross deformity noted to the right second toe or left fourth toe.  Strength within normal limits in all groups bilateral.  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 Consent obtained for excision of cyst and fusion of joint with possible K wire at right second toe as well as excision of lesion at left fourth toe.  Pre and Post op course explained. Risks, benefits, alternatives explained. No guarantees given or implied. Surgical booking slip submitted and provided patient with Surgical packet and info for [facility]. -To dispense postoperative shoes at surgical center -Advised patient to discuss with her cardiologist on tomorrow the extent of the surgery to make sure it is safe for her to undergo this procedure which will be done under local with IV sedation -Encouraged daily foot inspection in setting of diabetes and good glycemic control like before -Patient to return to office after surgery or sooner if condition worsens.  Landis Martins, DPM

## 2019-04-06 NOTE — Telephone Encounter (Signed)
Will forward to Dr. Pilar Plate who saw patient for this issue.

## 2019-04-07 ENCOUNTER — Encounter: Payer: Self-pay | Admitting: Cardiology

## 2019-04-07 ENCOUNTER — Telehealth: Payer: Self-pay | Admitting: Cardiology

## 2019-04-07 ENCOUNTER — Ambulatory Visit (INDEPENDENT_AMBULATORY_CARE_PROVIDER_SITE_OTHER): Payer: No Typology Code available for payment source | Admitting: Cardiology

## 2019-04-07 ENCOUNTER — Other Ambulatory Visit: Payer: Self-pay | Admitting: Family Medicine

## 2019-04-07 ENCOUNTER — Telehealth: Payer: Self-pay | Admitting: Sports Medicine

## 2019-04-07 VITALS — BP 138/91 | HR 78 | Ht 62.0 in | Wt 205.0 lb

## 2019-04-07 DIAGNOSIS — Z794 Long term (current) use of insulin: Secondary | ICD-10-CM | POA: Diagnosis not present

## 2019-04-07 DIAGNOSIS — E1169 Type 2 diabetes mellitus with other specified complication: Secondary | ICD-10-CM | POA: Diagnosis not present

## 2019-04-07 DIAGNOSIS — E782 Mixed hyperlipidemia: Secondary | ICD-10-CM

## 2019-04-07 DIAGNOSIS — Q969 Turner's syndrome, unspecified: Secondary | ICD-10-CM | POA: Diagnosis not present

## 2019-04-07 MED ORDER — ESTRADIOL 0.1 MG/GM VA CREA: 1.0000 | TOPICAL_CREAM | Freq: Every day | VAGINAL | 2 refills | Status: DC

## 2019-04-07 NOTE — Telephone Encounter (Signed)
New Rx sent. Pharmacy called.  Pharm to call pt when prepared.  Matilde Haymaker, MD

## 2019-04-07 NOTE — Telephone Encounter (Signed)
Patient called office stating that she saw her cardiologist today who is recommending that she get an Echo. Patient reports that she can not afford that test and is crying on the phone. 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 I advised patient that she needs to have her heart checked prior to any surgery or anesthesia. I also informed patient that her consent will be on file with our office for surgery and will be good for 90 days. I encouraged patient to call us back to schedule her surgery once she has had her Echo and has gotten clearance from the cardiologist.

## 2019-04-07 NOTE — Progress Notes (Signed)
Cardiology Office Note:    Date:  04/07/2019   ID:  Holly Richards, DOB [birth year], MRN [id number]  PCP:  Bonnita Hollow, MD  Cardiologist:  No primary care provider on file.  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  History of Present Illness:    Holly Richards is a 39 y.o. female with a hx of diabetes type 2, dyslipidemia, hypothyroidism, presents today for initial visit.  She has seen cardiology as a child and was told that she had a murmur but does not remember getting an echocardiogram.  She also has not had any cardiac surgery.  She did not follow-up with cardiology as an adult and has not had any real reason to.  She was asked to see cardiology by her PCP given her planned podiatric procedure.  The patient denies any symptoms of chest pain.  Shortness of breath, nausea vomiting.  Past Medical History:  Diagnosis Date  . Anxiety   . Bipolar disorder ([place])   . Migraines   . Thyroid disease     Past Surgical History:  Procedure Laterality Date  . CHOLECYSTECTOMY    . COLONOSCOPY    . CORNEAL TRANSPLANT      Current Medications: Current Meds  Medication Sig  . atorvastatin (LIPITOR) 80 MG tablet Take 80 mg by mouth daily.  . Blood Glucose Monitoring Suppl (ONETOUCH VERIO) w/Device KIT 1 kit by Does not apply route daily.  . Continuous Blood Gluc Receiver (DEXCOM G6 RECEIVER) DEVI 1 Product by Other route as directed.  . Continuous Blood Gluc Sensor (DEXCOM G6 SENSOR) MISC Inject 1 applicator into the skin as directed. Change sensor every 10 days.  . Continuous Blood Gluc Transmit (DEXCOM G6 TRANSMITTER) MISC Inject 1 Device into the skin as directed. Change transmitter every 90 days (will reuse 9 times)  . dicyclomine (BENTYL) 20 MG tablet Take 20 mg by mouth every 6 (six) hours as needed.  . empagliflozin (JARDIANCE) 10 MG TABS tablet Take 10 mg by mouth daily.    Marland Kitchen estradiol (ESTRACE) 0.1 MG/GM vaginal cream Place 1 Applicatorful vaginally daily. Insert 2 to 4 g daily intravaginally for 1 to 2 weeks, then gradually reduce to 1/2 the initial dose for 1 to 2 weeks, followed by a maintenance dose of 1 g 1 to 3 times per week.  . ezetimibe (ZETIA) 10 MG tablet Take 1 tablet (10 mg total) by mouth daily.  . insulin aspart (NOVOLOG FLEXPEN) 100 UNIT/ML FlexPen Inject 7 Units into the skin 3 (three) times daily before meals.  . Insulin Glargine (BASAGLAR KWIKPEN) 100 UNIT/ML SOPN Inject 0.18 mLs (18 Units total) into the skin daily.  . Insulin Pen Needle (PEN NEEDLES) 31G X 8 MM MISC TO BE USED WITH LANTUS PEN, ONCE A DAY.  Marland Kitchen Lancet Device MISC Use with glucometer  . levothyroxine (SYNTHROID) 88 MCG tablet Take 1 tablet (88 mcg total) by mouth every morning. 30 minutes before food  . lisinopril (ZESTRIL) 10 MG tablet Take 1 tablet (10 mg total) by mouth daily.  . Multiple Vitamins-Minerals (MULTIVITAMIN PO) Take 1 tablet by mouth daily.  . Omega-3 Fatty Acids (FISH OIL) 435 MG CAPS Take 1 capsule (435 mg total) by mouth 2 (two) times daily.  . Semaglutide, 1 MG/DOSE, 2 MG/1.5ML SOPN Inject 1 mg into the skin once a week.     Allergies:   Oseltamivir  phosphate   Social History   Socioeconomic History  . Marital status: Married    Spouse name: Not on file  . Number of children: Not on file  . Years of education: Not on file  . Highest education level: Not on file  Occupational History  . Not on file  Tobacco Use  . Smoking status: Never Smoker  . Smokeless tobacco: Never Used  Substance and Sexual Activity  . Alcohol use: Not on file  . Drug use: Not on file  . Sexual activity: Not on file  Other Topics Concern  . Not on file  Social History Narrative  . Not on file   Social Determinants of Health   Financial Resource Strain:   . Difficulty of Paying Living Expenses: Not on file  Food Insecurity:   . Worried About Charity fundraiser in the  Last Year: Not on file  . Ran Out of Food in the Last Year: Not on file  Transportation Needs:   . Lack of Transportation (Medical): Not on file  . Lack of Transportation (Non-Medical): Not on file  Physical Activity:   . Days of Exercise per Week: Not on file  . Minutes of Exercise per Session: Not on file  Stress:   . Feeling of Stress : Not on file  Social Connections:   . Frequency of Communication with Friends and Family: Not on file  . Frequency of Social Gatherings with Friends and Family: Not on file  . Attends Religious Services: Not on file  . Active Member of Clubs or Organizations: Not on file  . Attends Archivist Meetings: Not on file  . Marital Status: Not on file     Family History: The patient's family history includes Alcohol abuse in her father; Amenorrhea in her brother; Autoimmune disease in her mother; Diabetes in her maternal grandfather and maternal grandmother; Hyperlipidemia in her father; Hypertension in her father.  ROS:   Review of Systems  Constitution: Negative for decreased appetite, fever and weight gain.  HENT: Negative for congestion, ear discharge, hoarse voice and sore throat.   Eyes: Negative for discharge, redness, vision loss in right eye and visual halos.  Cardiovascular: Negative for chest pain, dyspnea on exertion, leg swelling, orthopnea and palpitations.  Respiratory: Negative for cough, hemoptysis, shortness of breath and snoring.   Endocrine: Negative for heat intolerance and polyphagia.  Hematologic/Lymphatic: Negative for bleeding problem. Does not bruise/bleed easily.  Skin: Negative for flushing, nail changes, rash and suspicious lesions.  Musculoskeletal: Negative for arthritis, joint pain, muscle cramps, myalgias, neck pain and stiffness.  Gastrointestinal: Negative for abdominal pain, bowel incontinence, diarrhea and excessive appetite.  Genitourinary: Negative for decreased libido, genital sores and incomplete  emptying.  Neurological: Negative for brief paralysis, focal weakness, headaches and loss of balance.  Psychiatric/Behavioral: Negative for altered mental status, depression and suicidal ideas.  Allergic/Immunologic: Negative for HIV exposure and persistent infections.    EKGs/Labs/Other Studies Reviewed:    The following studies were reviewed today:   EKG:  The ekg ordered today demonstrates sinus rhythm, heart rate 78 bpm, nonspecific ST changes.  Recent Labs: 04/05/2019: BUN 8; Creatinine, Ser 0.54; Potassium 4.2; Sodium 143; TSH 9.950  Recent Lipid Panel    Component Value Date/Time   CHOL 137 09/06/2018 1628   TRIG 214 (H) 09/06/2018 1628   HDL 31 (L) 09/06/2018 1628   CHOLHDL 4.4 09/06/2018 1628   CHOLHDL 6.0 (H) 11/07/2015 1109   VLDL 63 (H) 11/07/2015 1109  LDLCALC 63 09/06/2018 1628   LDLDIRECT 145.1 12/05/2013 1626    Physical Exam:    VS:  BP (!) 138/91   Pulse 78   Ht 5' 2"  (1.575 m)   Wt 205 lb (93 kg)   SpO2 98%   BMI 37.49 kg/m     Wt Readings from Last 3 Encounters:  04/07/19 205 lb (93 kg)  04/05/19 205 lb 5 oz (93.1 kg)  01/06/19 198 lb (89.8 kg)     GEN: Well nourished, well developed in no acute distress HEENT: Webbed neck NECK: No JVD; No carotid bruits LYMPHATICS: No lymphadenopathy CARDIAC: S1S2 noted,RRR, no murmurs, rubs, gallops, bilateral dorsalis pedis pulse +2 RESPIRATORY:  Clear to auscultation without rales, wheezing or rhonchi  ABDOMEN: Soft, non-tender, non-distended, +bowel sounds, no guarding. EXTREMITIES: No edema, No cyanosis, no clubbing, appreciation of cystic lesion on her 4th toe. MUSCULOSKELETAL:  No edema; No deformity  SKIN: Warm and dry NEUROLOGIC:  Alert and oriented x 3, non-focal PSYCHIATRIC:  Normal affect, good insight  ASSESSMENT:    1. Type 2 diabetes mellitus with other specified complication, with long-term current use of insulin ([place])   2. Mixed hyperlipidemia   3. Turner syndrome   4. Elevated  triglycerides with high cholesterol    PLAN:    She has no symptoms of angina today.  I was able to take her blood pressure myself manually right side 138/91 mmHg, and left side 134/88 mmHg.   Continue patient her current medical regimen.   I would like to get a transthoracic echocardiogram on this patient given her history of Turner syndrome to rule out any valvular abnormalities and evaluate her descending aorta for ruling out any evidence of coarctation of the aorta.   I would like her to get the echocardiogram scheduled prior to her upcoming podiatric procedure given that she will be using IV sedation.  The patient is in agreement with the above plan. The patient left the office in stable condition.  The patient will follow up in 3 months postoperatively.   Medication Adjustments/Labs and Tests Ordered: Current medicines are reviewed at length with the patient today.  Concerns regarding medicines are outlined above.  Orders Placed This Encounter  Procedures  . EKG 12-Lead  . ECHOCARDIOGRAM COMPLETE   No orders of the defined types were placed in this encounter.   Patient Instructions  Medication Instructions:  Your physician recommends that you continue on your current medications as directed. Please refer to the Current Medication list given to you today.  *If you need a refill on your cardiac medications before your next appointment, please call your pharmacy*  Lab Work: NONE If you have labs (blood work) drawn today and your tests are completely normal, you will receive your results only by: Marland Kitchen MyChart Message (if you have MyChart) OR . A paper copy in the mail If you have any lab test that is abnormal or we need to change your treatment, we will call you to review the results.  Testing/Procedures: Your physician has requested that you have an echocardiogram. Echocardiography is a painless test that uses sound waves to create images of your heart. It provides your doctor  with information about the size and shape of your heart and how well your heart's chambers and valves are working. This procedure takes approximately one hour. There are no restrictions for this procedure.   Follow-Up: At [facility], you and your health needs are our priority.  As part of our continuing  mission to provide you with exceptional heart care, we have created designated Provider Care Teams.  These Care Teams include your primary Cardiologist (physician) and Advanced Practice Providers (APPs -  Physician Assistants and Nurse Practitioners) who all work together to provide you with the care you need, when you need it.  Your next appointment:   3 month(s)  The format for your next appointment:   In Person  Provider:   You will see DR. Harriet Masson.  Or, you can be scheduled with the following Advanced Practice Provider on your designated Care Team (at our [facility]):  Laurann Montana, FNP         Adopting a Healthy Lifestyle.  Know what a healthy weight is for you (roughly BMI <25) and aim to maintain this   Aim for 7+ servings of fruits and vegetables daily   65-80+ fluid ounces of water or unsweet tea for healthy kidneys   Limit to max 1 drink of alcohol per day; avoid smoking/tobacco   Limit animal fats in diet for cholesterol and heart health - choose grass fed whenever available   Avoid highly processed foods, and foods high in saturated/trans fats   Aim for low stress - take time to unwind and care for your mental health   Aim for 150 min of moderate intensity exercise weekly for heart health, and weights twice weekly for bone health   Aim for 7-9 hours of sleep daily   When it comes to diets, agreement about the perfect plan isnt easy to find, even among the experts. Experts at the [place] developed an idea known as the Healthy Eating Plate. Just imagine a plate divided into logical, healthy portions.   The emphasis is on diet  quality:   Load up on vegetables and fruits - one-half of your plate: Aim for color and variety, and remember that potatoes dont count.   Go for whole grains - one-quarter of your plate: Whole wheat, barley, wheat berries, quinoa, oats, brown rice, and foods made with them. If you want pasta, go with whole wheat pasta.   Protein power - one-quarter of your plate: Fish, chicken, beans, and nuts are all healthy, versatile protein sources. Limit red meat.   The diet, however, does go beyond the plate, offering a few other suggestions.   Use healthy plant oils, such as olive, canola, soy, corn, sunflower and peanut. Check the labels, and avoid partially hydrogenated oil, which have unhealthy trans fats.   If youre thirsty, drink water. Coffee and tea are good in moderation, but skip sugary drinks and limit milk and dairy products to one or two daily servings.   The type of carbohydrate in the diet is more important than the amount. Some sources of carbohydrates, such as vegetables, fruits, whole grains, and beans-are healthier than others.   Finally, stay active  Signed, Berniece Salines, DO  04/07/2019 5:21 PM    [place]

## 2019-04-07 NOTE — Telephone Encounter (Signed)
New message:     Patient calling stating that she had to cancel her ECHO because she can not afford it. Please call patient back.

## 2019-04-07 NOTE — Patient Instructions (Signed)
Medication Instructions:  Your physician recommends that you continue on your current medications as directed. Please refer to the Current Medication list given to you today.  *If you need a refill on your cardiac medications before your next appointment, please call your pharmacy*  Lab Work: NONE If you have labs (blood work) drawn today and your tests are completely normal, you will receive your results only by: Marland Kitchen MyChart Message (if you have MyChart) OR . A paper copy in the mail If you have any lab test that is abnormal or we need to change your treatment, we will call you to review the results.  Testing/Procedures: Your physician has requested that you have an echocardiogram. Echocardiography is a painless test that uses sound waves to create images of your heart. It provides your doctor with information about the size and shape of your heart and how well your heart's chambers and valves are working. This procedure takes approximately one hour. There are no restrictions for this procedure.   Follow-Up: At [facility], you and your health needs are our priority.  As part of our continuing mission to provide you with exceptional heart care, we have created designated Provider Care Teams.  These Care Teams include your primary Cardiologist (physician) and Advanced Practice Providers (APPs -  Physician Assistants and Nurse Practitioners) who all work together to provide you with the care you need, when you need it.  Your next appointment:   3 month(s)  The format for your next appointment:   In Person  Provider:   You will see DR. Harriet Masson.  Or, you can be scheduled with the following Advanced Practice Provider on your designated Care Team (at our [facility]):  Laurann Montana, FNP

## 2019-04-07 NOTE — Telephone Encounter (Signed)
Pharmacy needs new script sent over for estradiol that has specific instructions, quantity to dispense and refills.  Reginald Mangels,CMA

## 2019-04-08 ENCOUNTER — Telehealth: Payer: Self-pay | Admitting: Licensed Clinical Social Worker

## 2019-04-08 LAB — CYTOLOGY - PAP
Comment: NEGATIVE
Diagnosis: NEGATIVE
High risk HPV: NEGATIVE

## 2019-04-08 NOTE — Telephone Encounter (Signed)
Returned call to Pt.  Pt is tearful.  Per Pt her deductible for her insurance is $3000.    She cannot afford the echo, and I suspect she also would not be able to afford a foot surgery.  I encourage Pt to contact her insurance.  I also let her know I would send a message to our social worker to see if there is any assistance for her there.    Pt was appreciative of call.  Will forward to Dr. Harriet Masson as Juluis Rainier.

## 2019-04-08 NOTE — Telephone Encounter (Signed)
CSW referred to assist patient with possible options for deductible coverage. CSW contacted patient who shared she has over $2500 deductible with her insurance prior to coverage. Patient reports she is working two jobs and barely makes ends meet at this point and can not afford the $2500+ deductible. She has need for surgery and an ECHO and states she will have to hold off. CSW explained that [employer] [facility] will assist with a payment plan post procedures and she could explore with financial counseling. Patient reports that she can't afford a payment plan because she can barely make ends meet now. CSW explained no other resources availably to assist with insurance  Deductibles that we are aware of. Patient verbalizes understanding and appreciative of the call. Raquel Sarna, [place], [place]

## 2019-04-11 ENCOUNTER — Other Ambulatory Visit (HOSPITAL_BASED_OUTPATIENT_CLINIC_OR_DEPARTMENT_OTHER): Payer: No Typology Code available for payment source

## 2019-04-18 ENCOUNTER — Telehealth: Payer: Self-pay | Admitting: Pharmacist

## 2019-04-18 NOTE — Telephone Encounter (Signed)
Called patient on 04/18/2019 at 5:27 PM  Dexcom G6 Report(04/04/2019 - 04/18/2019) **Unable to determine readings from 04/09/2019 - 04/18/2019  Average Glucose: 222 mg/dL Standard Deviation: 53 mg/dL Glucose Management Indicator (GMI): N/A  Time in Range: -Very high (>250 mg/dL): 24% -High (181-250 mg/dL): 55% -In range (70-180 mg/dL): 21% -Low (54-69 mg/dL): 0% -Very low (<54 mg/dL): 0%   Dexcom G6 Report(03/06/2019 - 04/04/2019)  Average Glucose: 176 mg/dL Standard Deviation: 41 mg/dL Glucose Management Indicator (GMI): 7.5%  Time in Range: -Very high (>250 mg/dL): 5% -High (181-250 mg/dL): 35% -In range (70-180 mg/dL): 60% -Low (54-69 mg/dL): 0% -Very low (<54 mg/dL): 0%  Patient states she has been unable to attach a new Dexcom G6 sensor due to being busier at work. She think she has not worn Dexcom G6 sensor since 04/15/2019 and confirms she was wearing Dexcom G6 after 1/23. Unable to determine why readings from 04/09/2019 - 04/18/2019 are not showing up on dexcom clarity report.  Patient is happy about recent HbA1c reading of 8 (encouraged patient for her efforts!!!). Although A1c has decreased, asked patient what were her thoughts were on recent BG readings. She has noticed that BG readings are consistently >200 mg/dL most of the time -  she admits to frequently forgetting her Novolog dose and has not set reminders on her phone. She forgets Basaglar dose 1-2x per month. She does not forget Ozempic or Jardiance. Patient states she has been stressed with working more due to COVID-19. Re-iterated patient must take care of herself and the importance of adherence. Considering patient is undergoing significant stress and it does not appear her work will decrease anytime soon it is possible her insulin demands have increased. Increase Basaglar slightly from 18 units to 20 units (less aggressive change considering she has not been consistently administering Novolog and do not want her to experience  hypoglycemia when she does start again). Plan for her to continue Novolog 09-20-08, Ozempic 1 mg weekly (Thursdays), and Jardiance 10 mg daily. Patient verbalized understanding.  Will follow up with patient in 2 weeks.  Thank you for involving pharmacy to assist in providing this patient's care.   Drexel Iha, PharmD PGY2 Ambulatory Care Pharmacy Resident

## 2019-05-05 ENCOUNTER — Telehealth: Payer: Self-pay | Admitting: Pharmacist

## 2019-05-05 NOTE — Telephone Encounter (Signed)
Called Dexcom representative who stated that patients must log in to their clarity app every 20 days to make sure data is sharing with clinic. Dexcom recently updated Clarity app, which may have logged patient out of account. Will address with patient at follow up telephone call.  Thank you for involving pharmacy to assist in providing this patient's care.   Drexel Iha, PharmD PGY2 Ambulatory Care Pharmacy Resident

## 2019-05-05 NOTE — Telephone Encounter (Signed)
Called patient on 05/05/2019 at 11:49 AM  Dexcom G6 Report(04/15/19 - 04/28/19) Average Glucose: 235 mg/dL Standard Deviation: 56 mg/dL Glucose Management Indicator (GMI): N/A%  Time in Range: -Very high (>250 mg/dL): 39% -High (181-250 mg/dL): 42% -In range (70-180 mg/dL): 19% -Low (54-69 mg/dL): 0% -Very low (<54 mg/dL): 0%   Dexcom G6 Report(04/04/2019 - 04/18/2019) **Unable to determine readings from 04/09/2019 - 04/18/2019  Average Glucose: 222 mg/dL Standard Deviation: 53 mg/dL Glucose Management Indicator (GMI): N/A  Time in Range: -Very high (>250 mg/dL): 24% -High (181-250 mg/dL): 55% -In range (70-180 mg/dL): 21% -Low (54-69 mg/dL): 0% -Very low (<54 mg/dL): 0%   Patient reports adherence to DM regimen. She does not have any complaints at this time.   Unclear as to why dexcom clarity report is unable to show BG readings from 2/12 - 2/18. Patient states her BG readings look similar to 1/29 - 2/11. She wakes up every day with BG readings > 200 mg/dL.  BG readings are consistently > 200 mg/dL throughout the entire day although patient reports adherence (was recently struggling with adherence during last follow up call). BG appear to be worsening likely due to increased stress from increased hours at work and lifestyle (fast food intake and lack of exercise). Increase Basaglar from 20 units to 24 units.  Plan for her to continue Novolog 09-20-08, Ozempic 1 mg weekly (Thursdays), and Jardiance 10 mg daily. Patient verbalized understanding.  Will contact Dexcom to determine issue with Dexcom Clarity reports. All other patients appear to be sharing data with clinic in real time except for [facility].  Follow up in 2 weeks.  Thank you for involving pharmacy to assist in providing this patient's care.   Drexel Iha, PharmD PGY2 Ambulatory Care Pharmacy Resident

## 2019-05-06 ENCOUNTER — Telehealth: Payer: Self-pay | Admitting: Sports Medicine

## 2019-05-06 NOTE — Telephone Encounter (Signed)
Patient called wanting to know where she was supposed to have her ECHO done. I looked at Dr. Leeanne Rio notes and she stated she wanted the patient to see her cardiologist to get medical clearance for surgery and to have an ECHO done. I explained this to the patient and told her to let us know when this was done and we could schedule her surgery.

## 2019-05-09 ENCOUNTER — Other Ambulatory Visit: Payer: Self-pay

## 2019-05-09 ENCOUNTER — Ambulatory Visit (HOSPITAL_COMMUNITY)
Admission: RE | Admit: 2019-05-09 | Discharge: 2019-05-09 | Disposition: A | Discharge status: Home / Self Care | Payer: 59 | Source: Ambulatory Visit | Attending: Cardiology | Admitting: Cardiology

## 2019-05-09 ENCOUNTER — Telehealth: Payer: Self-pay | Admitting: *Deleted

## 2019-05-09 DIAGNOSIS — Q969 Turner's syndrome, unspecified: Secondary | ICD-10-CM

## 2019-05-09 DIAGNOSIS — E079 Disorder of thyroid, unspecified: Secondary | ICD-10-CM | POA: Diagnosis not present

## 2019-05-09 NOTE — Telephone Encounter (Signed)
Left message to return call and make follow up appointment to discuss echo results .

## 2019-05-09 NOTE — Telephone Encounter (Signed)
-----   Message from Berniece Salines, DO sent at 05/09/2019  1:51 PM EST ----- Please see if patient can come to discuss her echocardiogram results with me.

## 2019-05-09 NOTE — Progress Notes (Signed)
  Echocardiogram 2D Echocardiogram has been performed.  Darlina Sicilian M 05/09/2019, 10:48 AM

## 2019-05-10 ENCOUNTER — Telehealth: Payer: Self-pay | Admitting: *Deleted

## 2019-05-10 NOTE — Telephone Encounter (Signed)
Called patient on 05/10/2019 at 5:25 PM after seeing message regarding elevated BG readings.   Dexcom G6 Report(05/06/19 - 05/10/19) Average Glucose: 306 mg/dL Standard Deviation: 55 mg/dL Glucose Management Indicator (GMI): N/A%  Time in Range: -Very high (>250 mg/dL): 83% -High (181-250 mg/dL): 17% -In range (70-180 mg/dL): 0% -Low (54-69 mg/dL): 0% -Very low (<54 mg/dL): 0%  Her BG reading at 5:29 PM on 05/10/19 is 491 mg/dL while Dexcom CGM states it is "HI". Dexcom CGM cannot read above 400 mg/dL. Asked patient if BG readings she reported to Dr. McDiarmid were Dexcom CGM readings or manual fingerstick readings considering Dexcom CGM readings can be +/- 20 mg/dL off from a manual fingerstick reading. If > 20 mg/dL off then patient may be wearing a bad sensor. Patient confirmed readings she reported to Jazmin/Dr. McDiarmid were manual/fingerstick readings. She has given herself Lantus 22 units this morning. She gave herself Novolog 7 units at 11:15 AM before lunch. She has given herself an additional 7 units of Novolog at 4:30 PM while on the phone with Jazmin/Dr. McDiarmid (per Dr. McDiarmid's instructions). Patient confirms she has drank a gallon of water. Patient ate carrots, chicken, and handful of rice for dinner after phone call with Jazmin/Dr. McDiarmid - did not administer insulin for meal.  Patient denies illness or increased carbohydrate intake. Patient states she has received a steroid injection (received Kenalog injection) yesterday at orthopedics office for rotator cuff. Patient reports her pain is currently 2/10. Before the steroid injection, her pain was 7/10 (has worsened lately specifically this past week). It is likely that her BG is elevated due to steroid injection and increases in pain.  Called Dr. Valentina Lucks to discuss insulin management further. After discussion with Dr. Valentina Lucks, informed patient we will need to aggressively titrate insulin dose based on pain and recent steroid  injection. If patient begins to experience pain advised patient to administer Motrin 200 to 400 mg orally every 4 to 6 hours as needed. Advised patient to review Dexcom at 8:30 PM (three hours after she at dinner which was 5:30 PM) and if her BG is > 250 mg/dL she should take an additional 5 units of Novolog. Tomorrow (2/24) advised patient to increase Lantus from 22 units to 26 units daily and to increase Novolog from 7 units with meals to 10 units with meals. Will call patient tomorrow for further guidance and determine at that time if it is necessary to increase Novolog to 12 units with meals. Encouraged patient to continue to drink a significant amount of water. Patient verbalized understanding.  Considering Dexcom G6 CGM Clarity report shows BG readings every 5 minutes it may not be necessary for patient to come in for office appt and we can adjust insulin regimen via telephone. Will determine tomorrow during follow up call.   Thank you for involving pharmacy to assist in providing this patient's care.   Drexel Iha, PharmD PGY2 Ambulatory Care Pharmacy Resident

## 2019-05-10 NOTE — Telephone Encounter (Signed)
Patient called stating that she has dexcom meter and has been getting high readings today.  She states that it was 318 when she checked the reading when she woke up.  Her last meal was at 11:15am and she took 7 units of novolog at that time.  Patient states that it continued to rise to 499 currently (3:30pm).  She is not having any symptoms of hyperglycemia and has taken in 1/2 a gallon of water.  I precepted with Dr. McDiarmid and he advised that patient continue to push fluid due to the elevation and can take another 7 units of novolog.  She doesn't need to go to the ED since she is not having symptoms but he would like for her to be seen in clinic tomorrow to assess.  Patient states that she has to work and we didn't have any openings to accommodate her schedule.  Patient did make an appointment on Thursday to be seen.      Plan:  Continue to push water to help flush the glucose from her body.  Patient will also watch what she is eating and lessen her carb/sugar intake to see if this helps bring her readings down.  Patient will send [place] a mychart message tomorrow with reading for the next 12-18 hours.  She will take another 7 units of novolog to help bring down her glucose.  Patient will come to appointment on Thursday dependent on her readings tomorrow and will hopefully see Drexel Iha, [facility] while here.    Patient did mention before getting of the phone that her most recent read was 399 and that was at 4:10pm.  Will forward to MD to make aware.  Jazmin Hartsell,CMA

## 2019-05-11 ENCOUNTER — Ambulatory Visit: Payer: 59 | Admitting: Family Medicine

## 2019-05-11 NOTE — Telephone Encounter (Signed)
Patient calls nurse line stating her CBGs are still reading well over 500. Patient stated she has done everything advised by [facility], however CBGs still not going down. Patient appeared extremely anxious and worried. Scheduled her for this afternoon with Josefita Weissmann. Patient appreciative.

## 2019-05-11 NOTE — Telephone Encounter (Signed)
Called patient on 05/11/2019 at 3:17 PM   Dexcom G6 Report(05/09/19 - 05/11/19)  Average Glucose: 335 mg/dL  Standard Deviation: 62 mg/dL  Glucose Management Indicator (GMI): N/A%   Time in Range:  -Very high (>250 mg/dL): 86%  -High (181-250 mg/dL): 14%  -In range (70-180 mg/dL): 0%  -Low (54-69 mg/dL): 0%  -Very low (<54 mg/dL): 0%   Patient is anxious regarding significantly elevated BG readings. She states she administered 10 units of Novolog last night at 8:30 PM, Basaglar 26 units this AM, Novolog 10 units with breakfast, and Novolog 10 units with lunch. She ate lunch at 11 AM.   She is upset because the increase in insulin doses did not decrease her BG as rapidly as she would have liked. Based on Dexcom Clarity report it does not appear that Novolog administered last night decreased blood glucose levels likely because she needs more Novolog. Her BG decreased from >401 mg/dL at 12 AM to 307-400 mg/dL between 12 AM - 9AM. Since 9:15 AM her BG readings have been > 401 mg/dL.   It appears insulin requirements are significantly more than originally anticipated likely due to steroid injection/stress. Since it has been 3 hours since last insulin dose - advised patient to administer 5 units of Novolog on the phone. Patient will be eating dinner at 4:30. Told her it is okay to increase Novolog from 10 to 15 units with meals. Advised patient to increase Basaglar from 26 units to 30 units. Patient verbalized understanding.  Considering I will be adjusting her insulin doses I informed her it is likely unnecessary for her to come in to [facility] for appt. Patient confirms she is comfortable with me managing her BG readings via telephone.   Plan to contact patient later tonight to reassess BG readings and provide further insulin adjustments.  Thank you for involving pharmacy to assist in providing this patient's care.   Drexel Iha, PharmD PGY2 Ambulatory Care Pharmacy Resident

## 2019-05-11 NOTE — Telephone Encounter (Signed)
Called patient on 05/11/2019 at 11:31 PM   Patient's BG remains > 401 mg/dL after administering 8 units of Novolog at 8:30 PM. Per Dexcom Clarity report BG did not decrease < 401 mg/dL between 8:30 PM - 11:30 PM. Advised patient to administer 10 units of Novolog now (11:30 PM).   Recommend to administer Basaglar 30 units tomorrow morning. Before breakfast administer 18 units and if BG > 401 mg/dL then administer 20 units of Novolog prior to lunch.   Will contact patient tomorrow 05/12/19 between 1-2PM to re-assess BG management.   Patient verbalized understanding.   Thank you for involving pharmacy to assist in providing this patient's care.   Drexel Iha, PharmD PGY2 Ambulatory Care Pharmacy Resident

## 2019-05-11 NOTE — Telephone Encounter (Signed)
Was able to get in touch with patient on 05/11/19 at 8:30 PM.  Patient gave 5 units of Novolog at 3:30 PM as a correction dose and 15 units of Novolog at 4:30 PM as a food dose. Patient's BG remains > 401 mg/dL and did not decrease after administering Novolog injections.  Advised patient to administer 8 units of Novolog at 8:30 PM (has been 3 hours since prior correction dose). Will monitor to ensure her BG decreases. If patient's BG is >401 mg/dL at 11:30 PM advised patient to administer 10 units of Novolog prior to going to bed.   Advised patient tomorrow to administer 30 units of Basaglar in the AM and 15 units of Novolog with each meal. Will touch base with patient between 1-2 PM to re-assess BG readings and to provide further guidance.   Thank you for involving pharmacy to assist in providing this patient's care.   Drexel Iha, PharmD PGY2 Ambulatory Care Pharmacy Resident

## 2019-05-11 NOTE — Telephone Encounter (Signed)
Called patient twice on 05/11/2019 between 7:30 - 8:00 PM.  Patient was unable to talk since she is at work. Will contact patient again in 1 hour.   Thank you for involving pharmacy to assist in providing this patient's care.   Drexel Iha, PharmD PGY2 Ambulatory Care Pharmacy Resident

## 2019-05-11 NOTE — Telephone Encounter (Signed)
Noted and agree. 

## 2019-05-11 NOTE — Telephone Encounter (Signed)
Called patient on 05/11/2019 at 3:17 PM  Dexcom G6 Report(05/09/19 - 05/11/19) Average Glucose: 335 mg/dL Standard Deviation: 62 mg/dL Glucose Management Indicator (GMI): N/A%  Time in Range: -Very high (>250 mg/dL): 86% -High (181-250 mg/dL): 14% -In range (70-180 mg/dL): 0% -Low (54-69 mg/dL): 0% -Very low (<54 mg/dL): 0%  Patient is anxious regarding significantly elevated BG readings. She states she administered 10 units of Novolog last night at 8:30 PM, Lantus 26 units this AM, Novolog 10 units with breakfast, and Novolog 10 units with lunch. She ate lunch at 11 AM.   She is upset because the increase in insulin doses did not decrease her BG as rapidly as she would have liked. Based on Dexcom Clarity report it does not appear that Novolog administered last night decreased blood glucose levels likely because she needs more Novolog. Her BG decreased from >401 mg/dL at 12 AM to 300-400 mg/dL between 12 AM - 9AM. Since 9:15 AM her BG readings have been > 401 mg/dL.   It appears insulin requirements are significantly more than originally anticipated. Since it has been 3 hours since last insulin dose - advised patient to administer 5 units of Novolog on the pohne.

## 2019-05-12 ENCOUNTER — Telehealth: Payer: Self-pay | Admitting: Pharmacist

## 2019-05-12 ENCOUNTER — Ambulatory Visit: Payer: 59

## 2019-05-12 NOTE — Telephone Encounter (Signed)
error 

## 2019-05-12 NOTE — Telephone Encounter (Signed)
Noted and agree. 

## 2019-05-12 NOTE — Telephone Encounter (Signed)
Called patient on 05/12/2019 at 1:49 PM     Based on Clarity report patient's BG is trending down. Patient reports she administered Basaglar 30 units daily this AM. She administered Novolog 15 units prior to breakfast and Novolog 10 units prior to lunch (nervous to administer full 15 units at lunch due to how her BG trending down).  Advised patient to follow the following sliding scale for Novolog correction doses at 2:30 PM and 8:30 PM today.. 200 - 250 mg/dL = 4 units 251 - 300 mg/dL = 6 units 301 - 350 mg/dL = 8 units 351 - 400 mg/dL = 10 units >401 mg/dL = 12 units  Instructed patient to administer 15 units prior to meals (eats dinner at 5:30 PM tonight) and to increase Basaglar from 30 to 35 units tomorrow morning. Patient will STOP administering correction doses tomorrow. Patient verbalized understanding.  Thank you for involving pharmacy to assist in providing this patient's care.   Drexel Iha, PharmD PGY2 Ambulatory Care Pharmacy Resident

## 2019-05-13 NOTE — Telephone Encounter (Signed)
Called patient on 05/13/2019 at 2:59 PM and left HIPAA-compliant VM with instructions to contact me back  Plan to discuss BG readings. Will follow up with patient again later today.  Drexel Iha, PharmD PGY2 Ambulatory Care Pharmacy Resident

## 2019-05-13 NOTE — Telephone Encounter (Signed)
Called patient on 05/13/2019 at 3:26 PM       Based on Clarity report patient's BG is trending down. Patient administered 10 units of Novolog at 8:30 PM last night. Patient reports she administered Basaglar 30 units daily this AM (did NOT increase dose to 35 units due to confusion). She administered Novolog 15 units prior to breakfast ~8:45 AM and Novolog 15 units prior to lunch  ~12:30 PM.  Today (2/26) Plan Advised patient to administer Novolog 15 units prior to dinner. Also, follow the following sliding scale for Novolog correction doses at 8:30 PM AGAIN today since she did not increase basal insulin as instructed.. 200 - 250 mg/dL = 4 units 251 - 300 mg/dL = 6 units 301 - 350 mg/dL = 8 units 351 - 400 mg/dL = 10 units >401 mg/dL = 12 units  Tomorrow (2/27) Plan Instructed patient to administer 15 units prior to meals and to increase Basaglar from 30 to 35 units tomorrow morning. Patient will STOP administering correction doses tomorrow. Patient verbalized understanding.  Will follow up with patient on 2/28 or 3/1 based on Dexcom Clarity readings.   Thank you for involving pharmacy to assist in providing this patient's care.   Drexel Iha, PharmD PGY2 Ambulatory Care Pharmacy Resident

## 2019-05-16 ENCOUNTER — Telehealth: Payer: Self-pay | Admitting: Pharmacist

## 2019-05-16 NOTE — Telephone Encounter (Signed)
Called patient on 05/16/2019 at 2:52 PM       Patient states she has increased her Basaglar from 30 units to 35 units on Saturday (05/14/2019). She confirms she administered Basaglar 35 units on Saturday (05/14/2019) and Sunday (05/15/2019). She confirms she has stopped administering correction doses. She states she has given Novolog 15 units on Saturday morning prior to breakfast. She was gone most of the day on Saturday and Sunday therefore did not administer any more Novolog for the remainder of the weekend.   Advised patient to increase Basaglar from 35 units to 40 units daily and to continue Novolog 15 units prior to meal. Stressed the importance of remaining adherent to Novolog. Advised patient to contact me with any concerns. Patient verbalized understanding.  Will follow up with patient again on 05/19/2019.  Thank you for involving pharmacy to assist in providing this patient's care.   Drexel Iha, PharmD PGY2 Ambulatory Care Pharmacy Resident

## 2019-05-16 NOTE — Telephone Encounter (Signed)
Noted and agree. 

## 2019-05-19 ENCOUNTER — Telehealth: Payer: Self-pay | Admitting: Pharmacist

## 2019-05-19 NOTE — Telephone Encounter (Signed)
Reviewed and agree.

## 2019-05-19 NOTE — Telephone Encounter (Signed)
Called patient on 05/19/2019 at 12:59 PM     Patient thought she had Dexcom sensor/transmitter attached Mon (3/1) to Wed (3/3) - however, patient was mistaken. Patient realized it was not attached and attached Dexcom sensor/transmitter today (3/4). She reports adherence to Basaglar 40 units daily and Novolog 15 units before meals. (3 meals/day).  Appears BG readings are trending down, which could be attributed to increased insulin doses and/or steroid injection wearing off. Will continue to monitor. Since BG remain elevated, plan for patient to increase Basaglar from 40 units to 45 units and Novolog from 15 to 18 units before meals. Patient verbalized understanding.  Will follow up again on Monday (05/23/2019).  Thank you for involving pharmacy to assist in providing this patient's care.   Drexel Iha, PharmD PGY2 Ambulatory Care Pharmacy Resident

## 2019-05-23 ENCOUNTER — Telehealth: Payer: Self-pay | Admitting: Pharmacist

## 2019-05-23 NOTE — Telephone Encounter (Signed)
Noted and agree. 

## 2019-05-23 NOTE — Telephone Encounter (Signed)
Called patient on 05/23/2019 at 11:28 AM        Patient reports adherence to Basaglar 45 units daily and Novolog 18 units before meals (eats 3 meals/day). She states she increased her fast food intake this weekend. On Saturday she went snow tubing and ate fast food for each meal. On Sunday she went to a comedy show and ate [place] food for dinner. Asked patient if she ate late at night on 05/22/19 considering BG spiked around 1:30 AM - patient denies.   It appears patient's insulin demands may have increased which could be attributed to increased fast food intake. However, it is interesting as patient was active snow tubing on Saturday and it did not appear to decrease BG. Plan to increase Basaglar from 45 to 48 units daily and Novolog from 18 to 22 units daily. Patient verbalized understanding.   Follow up with patient on 05/26/2019.  Thank you for involving pharmacy to assist in providing this patient's care.   Drexel Iha, PharmD PGY2 Ambulatory Care Pharmacy Resident

## 2019-05-26 ENCOUNTER — Telehealth: Payer: Self-pay

## 2019-05-26 ENCOUNTER — Telehealth: Payer: Self-pay | Admitting: Pharmacist

## 2019-05-26 ENCOUNTER — Encounter: Payer: Self-pay | Admitting: *Deleted

## 2019-05-26 ENCOUNTER — Encounter: Payer: Self-pay | Admitting: Sports Medicine

## 2019-05-26 NOTE — Telephone Encounter (Signed)
Erroneous Error

## 2019-05-26 NOTE — Telephone Encounter (Signed)
Noted and agree. 

## 2019-05-26 NOTE — Telephone Encounter (Signed)
DOS 06/06/19  EXC GANGLION TOE 2ND RT & 4TH LT - 28092 ARTHRODESIS INTERPHAL JOINT 2ND RT - [id number]  [employer] EFFECTIVE DATE - 12/15/2018  PLAN DEDUCTIBLE - $2800 W/$0.00 REMAINING  OUT OF POCKET - $5600 W/ $2532.70 REMAINING  COINSURNACE - 80%  COPAY - $0.00  PER AUTOMATIVE SYSTEM CPT 28092 & [phone] DO NOT REQUIRE PERCERT.  REF # [id number]

## 2019-05-26 NOTE — Telephone Encounter (Signed)
Called patient on 05/26/2019 at 9:36 AM          Patient reports adherence to Basaglar 48 units daily and Novolog 22 units prior to meals (3 meals/day). She reports she is drinking 2 gallons of water daily and has changed her breakfast to lower carb (omelette, sausage, a few grapes, coffee with a little milk). Encouraged patient for her adherence and improvements in diet. We both notice that her diet is better during the week (eats more fastfood during the weekend).  Patient's BG are trending down, however, are still elevated. Will be cautious titrating insulin considering it is challenging to determine when effects of steroid will affect BG less. Patient is not experiencing hypoglycemia. Plan to increase Basaglar from 48 units to 50 units daily and to increase Novolog from 22 to 24 units prior to meals. Patient verbalized understanding.  Will follow up 05/30/2019  Thank you for involving pharmacy to assist in providing this patient's care.   Drexel Iha, PharmD PGY2 Ambulatory Care Pharmacy Resident

## 2019-05-30 ENCOUNTER — Telehealth: Payer: Self-pay | Admitting: Pharmacist

## 2019-05-30 DIAGNOSIS — E119 Type 2 diabetes mellitus without complications: Secondary | ICD-10-CM

## 2019-05-30 MED ORDER — PEN NEEDLES 31G X 8 MM MISC: 11 refills | Status: DC

## 2019-05-30 NOTE — Telephone Encounter (Signed)
Called patient on 05/30/2019 at 2:43 PM   Patient requests refills of pen needles. Sent rx for pen needles with refills to preferred pharmacy.  Thank you for involving pharmacy to assist in providing this patient's care.   Drexel Iha, PharmD PGY2 Ambulatory Care Pharmacy Resident

## 2019-06-02 ENCOUNTER — Telehealth: Payer: Self-pay | Admitting: Pharmacist

## 2019-06-02 NOTE — Telephone Encounter (Signed)
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 She has not taken any basal/bolus insulin since Monday (05/30/2019) since she ran out of pen needles (I sent in prescription for additional needles 05/30/2019). Patient states she has not been able to pick up pen needles. She reports adherence to Ozempic 1 mg weekly (Thursdays) and Jardiance 10 mg daily. She is not wearing the Dexcom G6 CGM currently. She has manually checked her BG readings. Per patient report, she states her BG readings have been in the low 200s prior to lunch time. She ate [place] this morning and noticed her BG was ~385 mg/dL afterwards. She has experiences 1-2 episodes of nocturia per night (has decreased from when she initially received her steroid dose a few weeks ago (was experiences 4-5x per night)).  She voices that she wants to make improvement in her DM management - restarting Dexcom CGM today, picking up pen needles tomorrow, receiving a planner in the mail today (planning on recording her insulin doses to improve adherence), and purchasing [employer] meals. It is challenging to fully assess BG readings considering patient stopped wearing Dexcom CGM and has only taken a few manual BG readings. It is impressive her BG readings are mostly in the lower 200s when she stopped administering basal/bolus insulin regimen - likely indicative of steroid dose wearing off and possibly reflective of improvements in diet. Will decrease basal/bolus regimen from Basaglar 50 units daily to 45 units daily AND Novolog 24 units prior to meals to Novolog 15 units prior to meals (except for when BG are >250 mg/dL --> then administer Novolog 20 units prior to meals) since patient self-discontinued basal/bolus regimen to ensure patient tolerates restarting insulin regimen. Patient verbalized understanding. Will follow up in 1 week.  Thank you for involving  pharmacy to assist in providing this patient's care.   Drexel Iha, PharmD PGY2 Ambulatory Care Pharmacy Resident

## 2019-06-02 NOTE — Telephone Encounter (Signed)
Noted and agree. 

## 2019-06-09 ENCOUNTER — Telehealth: Payer: Self-pay | Admitting: Pharmacist

## 2019-06-09 NOTE — Telephone Encounter (Signed)
Called patient on 06/09/2019 at 11:01 AM          Patient reports adherence most of the time to Lantus 45 units daily, Novolog 15 units prior to meals, Ozempic 1 mg weekly (Thursdays), and Jardiance 10 mg daily. She does not think she administered insulin on Saturday. She attributes elevated BG readings to increased intake of fast food and lack of insulin administration. However, patient has started logging doses in Dexcom to track her adherence - encouraged patient for increased motivation to improve DM control!   Patient BG readings are tightly controlled between 100-150 mg/dL and she is not experiencing hypoglycemia. Decrease in BG readings are likely due to steroid dose wearing off and increase in adherence to DM medications. Will continue DM medication regimen (Lantus 45 units daily, Novolog 15 units prior to meals, Ozempic 1 mg weekly (Thursdays), and Jardiance 10 mg daily). Advised patient to contact me if she experiences hypoglycemia to adjust insulin doses. Patient verbalized understanding. Will contact patient in 1 week.  Thank you for involving pharmacy to assist in providing this patient's care.   Drexel Iha, PharmD PGY2 Ambulatory Care Pharmacy Resident

## 2019-06-10 ENCOUNTER — Telehealth: Payer: Self-pay

## 2019-06-10 NOTE — Telephone Encounter (Signed)
Caren Griffins from [place] called stating the patient has cancelled her surgery for 06/13/19. Elisia stated that she gets off work at 2:00pm and can not be at the surgical center anytime prior to that. I will notify Dr. Cannon Kettle and see what we can do to accommodate the patient.

## 2019-06-10 NOTE — Telephone Encounter (Signed)
Thanks for letting me know. If patient wants to proceed with getting surgery she needs to discuss it with her job to make plans to be out of work or the get off earlier in order for [place] to be able to do the surgery. There are no other accommodations that I can recommend at this time. Thanks Dr. Cannon Kettle

## 2019-06-10 NOTE — Telephone Encounter (Signed)
Spoke to Holly Richards and informed her that once she is able to get time off of work to call us and we would get her scheduled for surgery. She stated she understood that and would let us know.

## 2019-06-14 ENCOUNTER — Other Ambulatory Visit: Payer: Self-pay | Admitting: Family Medicine

## 2019-06-14 DIAGNOSIS — E119 Type 2 diabetes mellitus without complications: Secondary | ICD-10-CM

## 2019-06-15 ENCOUNTER — Encounter: Payer: No Typology Code available for payment source | Admitting: Sports Medicine

## 2019-06-15 ENCOUNTER — Other Ambulatory Visit: Payer: Self-pay | Admitting: *Deleted

## 2019-06-15 DIAGNOSIS — E119 Type 2 diabetes mellitus without complications: Secondary | ICD-10-CM

## 2019-06-16 ENCOUNTER — Telehealth: Payer: Self-pay | Admitting: Pharmacist

## 2019-06-16 NOTE — Telephone Encounter (Signed)
        Patient reports adherence to Lantus 45 units daily, Novolog 15 units prior to meals, Ozempic 1 mg weekly (Thursdays), and Jardiance 10 mg daily.Patient continues to log insulin doses in Dexcom to track her adherence - encouraged patient again for her motivation to improve DM control!   Patient BG readings are tightly controlled between 100-150 mg/dL and she is not experiencing hypoglycemia. Decrease in BG readings are likely due to steroid dose wearing off and increase in adherence to DM medications. Will continue DM medication regimen (Lantus 45 units daily, Novolog 15 units prior to meals, Ozempic 1 mg weekly (Thursdays), and Jardiance 10 mg daily). Advised patient to contact me if she experiences hypoglycemia to adjust insulin doses. Patient verbalized understanding. Will contact patient in 2 week.  Thank you for involving pharmacy to assist in providing this patient's care.   Drexel Iha, PharmD PGY2 Ambulatory Care Pharmacy Resident

## 2019-06-18 MED ORDER — BASAGLAR KWIKPEN 100 UNIT/ML ~~LOC~~ SOPN: 20.0000 [IU] | PEN_INJECTOR | Freq: Every day | SUBCUTANEOUS | 0 refills | Status: DC

## 2019-06-20 ENCOUNTER — Encounter: Payer: No Typology Code available for payment source | Admitting: Podiatry

## 2019-06-21 MED ORDER — BASAGLAR KWIKPEN 100 UNIT/ML ~~LOC~~ SOPN: 20.0000 [IU] | PEN_INJECTOR | Freq: Every day | SUBCUTANEOUS | 0 refills | Status: DC

## 2019-06-21 NOTE — Telephone Encounter (Signed)
Medication resent to pharmacy.  It was originally ordered as no print.  Coby Shrewsberry,CMA

## 2019-06-21 NOTE — Addendum Note (Signed)
Addended by: Valerie Roys on: 06/21/2019 09:01 AM   Modules accepted: Orders

## 2019-06-23 ENCOUNTER — Other Ambulatory Visit: Payer: Self-pay | Admitting: Family Medicine

## 2019-06-23 DIAGNOSIS — E119 Type 2 diabetes mellitus without complications: Secondary | ICD-10-CM

## 2019-06-27 ENCOUNTER — Telehealth: Payer: Self-pay | Admitting: Pharmacist

## 2019-06-27 NOTE — Telephone Encounter (Signed)
Called patient on 06/27/2019 at 5:06 PM   Patient's BG readings are 100-200 mg/dL most of the time except this past week (forgot insulin, increased fast food intake). Stressed the importance of adherence to insulin. She is not experiencing hypoglycemia.  Will continue DM medication regimen (Lantus 45 units daily, Novolog 15 units prior to meals, Ozempic 1 mg weekly (Thursdays), and Jardiance 10 mg daily). Will follow up with patient in 2 weeks. Advised patient to contact me in the interim timeframe if she experiences any concerns to DM medications and/or hypoglycemic events. Patient verbalized understanding.  Thank you for involving pharmacy to assist in providing this patient's care.   Drexel Iha, PharmD PGY2 Ambulatory Care Pharmacy Resident

## 2019-06-29 ENCOUNTER — Encounter: Payer: No Typology Code available for payment source | Admitting: Sports Medicine

## 2019-06-30 ENCOUNTER — Other Ambulatory Visit: Payer: Self-pay | Admitting: Family Medicine

## 2019-06-30 NOTE — Telephone Encounter (Signed)
Will refill, but please have patient schedule follow up appointment in order to obtain repeat thyroid labs to determine if further dose adjustments are indicated. Thank you.

## 2019-06-30 NOTE — Telephone Encounter (Signed)
Patient called and LVM to return phone call to office to schedule follow up appointment.   Talbot Grumbling, RN

## 2019-07-13 ENCOUNTER — Encounter: Payer: No Typology Code available for payment source | Admitting: Sports Medicine

## 2019-07-15 ENCOUNTER — Telehealth: Payer: Self-pay | Admitting: Pharmacist

## 2019-07-15 ENCOUNTER — Other Ambulatory Visit: Payer: Self-pay | Admitting: Family Medicine

## 2019-07-15 DIAGNOSIS — E119 Type 2 diabetes mellitus without complications: Secondary | ICD-10-CM

## 2019-07-15 NOTE — Telephone Encounter (Signed)
Called patient on 07/15/2019 at 4:56 PM   Checked dexcom clarity report. BG have been > 200mg /dL most of the time. Asked pt if she had been experiencing any issues. She states she went a few days without picking up insulin prescriptions and she has forgotten to give insulin before meals. Stressed importance of staying adherent to DM meds. Advised patient to continue current DM regimen (Lantus 45 units daily, Novolog 15 units prior to meals, Ozempic 1 mg weekly (Thursdays), and Jardiance 10 mg daily). . Given upcoming off-site rotation and end of residency approaching will be unable to follow up with patient in 07/2019. Will have Dr. Valentina Lucks follow with patient in 2 weeks.  Thank you for involving pharmacy to assist in providing this patient's care.   Drexel Iha, PharmD PGY2 Ambulatory Care Pharmacy Resident      Thank you for involving pharmacy to assist in providing this patient's care.   Drexel Iha, PharmD PGY2 Ambulatory Care Pharmacy Resident

## 2019-07-30 ENCOUNTER — Other Ambulatory Visit: Payer: Self-pay | Admitting: Family Medicine

## 2019-08-04 ENCOUNTER — Telehealth: Payer: Self-pay | Admitting: Pharmacist

## 2019-08-04 NOTE — Telephone Encounter (Signed)
Noted and agree. 

## 2019-08-04 NOTE — Telephone Encounter (Signed)
Contacted patient in follow-up of blood sugar control.  Patient reports blood sugars are doing well.  She reports readings in the 100s with occasional elevated readings which are explained well by dietary excursions per patient report.   Patient is planning to start "Optivia Diet" in the upcoming week.  She is current without sensor/tranmitter and is awaiting supply refill.  She states that CGM is working well.   We discussed potential reductions in insulin if diet is low in carbohydrate.  We agreed to reducing Lantus from 45 to 35 units once daily upon starting this diet.   She was instructed to monitor and adjust her rapid based on CGM readings.  We reviewed hypoglycemia S/Sx and management plans.   Follow-up in ~ 2 weeks. Informed patient to contact us earlier if needed.

## 2019-08-05 ENCOUNTER — Other Ambulatory Visit: Payer: Self-pay | Admitting: Student in an Organized Health Care Education/Training Program

## 2019-08-05 DIAGNOSIS — E119 Type 2 diabetes mellitus without complications: Secondary | ICD-10-CM

## 2019-08-11 ENCOUNTER — Telehealth: Payer: Self-pay | Admitting: Pharmacist

## 2019-08-11 NOTE — Telephone Encounter (Addendum)
Called patient on 08/11/2019 at 8:39 AM   Patient contacted me this morning regarding concern for lower BG reading - 87 mg/dL. She has started the [employer] - 6 small meals/day with 1 meal of lean greens per day this past Saturday 08/06/2019. She has completely cut out soda as well. She also has a Engineer, maintenance and is responsible for reading 2 health books educating her about food/portion size.         She is wondering if she can decrease her DM medications. Heavily encouraged patient for her success in lifestyle modifications!!!! Explained to patient since her BG readings are within target range to continue DM medications. If she decides to eat meals without any carbs and/or if she notices hypoglycemia advised patient to contact [facility] (Dr. Valentina Lucks as I will not be offsite/on PAL these upcoming 2 weeks). Patient verbalized understanding.   Continue Lantus 45 units daily, Novolog 15 units prior to meals, Ozempic 1 mg weekly (Thursdays), and Jardiance 10 mg daily  Thank you for involving pharmacy to assist in providing this patient's care.   Drexel Iha, PharmD PGY2 Ambulatory Care Pharmacy Resident

## 2019-08-16 ENCOUNTER — Telehealth: Payer: Self-pay | Admitting: Pharmacist

## 2019-08-16 NOTE — Telephone Encounter (Signed)
Patient contacted me last Friday (08/12/2019). Unable to document as Epic was down.            Patient experienced multiple lows (all treated appropriately) on 08/11/2019. Advised patient to decrease Lantus from 45 units daily to 40 units daily AND Novolog from 15 units three times daily prior to meals to 12 units three times daily prior to meals. Patient administered Novolog 12 units prior to first meal on 08/12/19 and experienced additional hypoglycemia episode and contacted me again. Advised patient to decrease Novolog 6 units daily three times daily prior to meals. Contacted patient today (08/16/2019) and reviewed Dexcom Clarity report - pateint is doing well and is not experiencing any more episodes of hypoglycemia. Continue Lantus 40 units daily and Novolog 6 units three times daily with meals. Will follow up in 2 weeks to determine if patient needs additional insulin adjustment. Advised patient to contact [facility] clinic in meantime if any additional concerns arise.  Thank you for involving pharmacy to assist in providing this patient's care.   Drexel Iha, PharmD PGY2 Ambulatory Care Pharmacy Resident

## 2019-08-31 ENCOUNTER — Telehealth: Payer: Self-pay | Admitting: Family Medicine

## 2019-08-31 NOTE — Telephone Encounter (Signed)
Patient is calling and would like for [facility] or Dr. Valentina Lucks to give her a call. She has some questions about her blood sugar. The best call back is [phone].

## 2019-09-01 ENCOUNTER — Telehealth: Payer: Self-pay | Admitting: Pharmacist

## 2019-09-01 NOTE — Telephone Encounter (Signed)
Patient called office on 09/01/2019 at 10:48 AM   Pt's BG readings are consistently 70-90 mg/dL per [facility] Clarity report. She states she stopped taking Ozempic (was on 1 mg) last week. She takes Lantus 40 units daily and Novolog 5 units with breakfast. Still taking Jardiance. She calls and wants to be on less medications - for DM and other disease states (BP, cholesterol).  Advised patient to restart Ozempic 1mg . Decrease Lantus from 40 units to 30 units. Discontinue Novolog. Continue Jardiance 10 mg. Call if she has additional concerns with BG.  Patient has BP cuff. Advised her to monitor and record BP readings once daily. She is on lisinopril 10 mg daily May be able to decrease lisinopril dose if she is interested.  Scheduled appt for next Thursday (09/08/19) to get fasting lipid panel (last done in 08/2018) to review cholesterol and determine if she can discontinue any HLD meds. She is on atorvastatin 80 mg / Zetia / OTC fish oil. It is possible we may be able to D/C fish oil and Zetia depending on LDL.   Thank you for involving pharmacy to assist in providing this patient's care.   Drexel Iha, PharmD PGY2 Ambulatory Care Pharmacy Resident

## 2019-09-02 ENCOUNTER — Telehealth: Payer: Self-pay | Admitting: Family Medicine

## 2019-09-02 NOTE — Telephone Encounter (Signed)
**  After Hours/ Emergency Line Call**  Received a call to report that Holly Richards had Dexcom reading of 43.  States that she ate a girl [employer] cookie, checked her CBG with finger stick and it was in 68s.  She was asymptomatic during this time.  Reports that she has been adjusting her diabetes regimen with Pharmacy Team due to continued low sugars.  Was recently decreased from Lantus 40 to 30 u QD, which she took this AM.  She is no longer on novolog, and takes Ozempic and Jardiance.  Advised to decrease Lantus to 20u tomorrow AM and explained lag of Dexcom reading compared to finger stick.  She voiced understanding.  Advised that if she has symptomatic hypoglycemia that does not improve with eating, she should come to ED.  Also advised that if she has significantly elevated sugars with inability to tolerate PO, she should come to ED.  Advised that if sugars are improved with this decrease, can wait until appointment on 6/24, but if she continues to have difficulty with controlling sugars over the weekend, she should call first thing Monday morning to schedule a sooner appointment.  Red flags discussed.  Will forward to PCP and Pharmacy team.   [place] Constable, DO PGY-2, [facility] 09/02/2019 9:29 PM

## 2019-09-08 ENCOUNTER — Ambulatory Visit (INDEPENDENT_AMBULATORY_CARE_PROVIDER_SITE_OTHER): Payer: Self-pay | Admitting: Pharmacist

## 2019-09-08 ENCOUNTER — Other Ambulatory Visit: Payer: Self-pay

## 2019-09-08 ENCOUNTER — Encounter: Payer: Self-pay | Admitting: Pharmacist

## 2019-09-08 DIAGNOSIS — E119 Type 2 diabetes mellitus without complications: Secondary | ICD-10-CM

## 2019-09-08 DIAGNOSIS — E782 Mixed hyperlipidemia: Secondary | ICD-10-CM

## 2019-09-08 LAB — POCT GLYCOSYLATED HEMOGLOBIN (HGB A1C): HbA1c, POC (controlled diabetic range): 6.2 % (ref 0.0–7.0)

## 2019-09-08 NOTE — Patient Instructions (Addendum)
It was a pleasure seeing you in clinic today!  1. GREAT JOB WITH YOUR DIABETES MANAGEMENT !!!!!! :) 2. Decrease Basaglar from 20 units daily to 10 units daily  3. Continue Jardiance and Ozempic 4. I will call you tomorrow with results for your cholesterol and discuss possibly discontinuing cholesterol medications  Please call the PharmD clinic at [phone] if you have any questions that you would like to speak with a pharmacist about (Dr. Nicholas Lose or Dr. Drexel Iha).   Please remember... 1. Sensor will last 10 days 2. Transmitter will last 90 days and must be reused 3. Sensor should be applied to area away from waistband, scarring, tattoos, irritation, and bones. 4. Transmitter must be within 20 feet of receiver/cell phone. 5. If using Dexcom G6 app on cell phone, please remember to keep app open (do not close out of app). 6. Do a fingerstick blood glucose test if the sensor readings do not match how    you feel 7. Remove sensor prior to magnetic resonance imaging (MRI), computed tomography (CT) scan, or high-frequency electrical heat (diathermy) treatment. 8. Do not allow sun screen or insect repellant to come into contact with Dexcom G6. These skin care products may lead for the plastic used in the Dexcom G6 to crack. 9. Dexcom G6 may be worn through a Environmental education officer. It may not be exposed to an advanced Imaging Technology (AIT) body scanner (also called a millimeter wave scanner) or the baggage x-ray machine. Instead, ask for hand-wanding or full-body pat-down and visual inspection.  10. Doses of acetaminophen (Tylenol) >1 gram every 6 hours may cause false high readings. 11. Hydroxyurea (Hydrea, Droxia) may interfere with accuracy of blood glucose readings from Dexcom G6. 12. Store sensor kit between 36 and 86 degrees Farenheit. Can be refrigerated within this temperature range.  Ordering Overlay Patches 1. Go to the following website every 30 days to order new overlay  patches:  [email or web address]  Problems with Dexcom sticking? 1. Order Skin Tac from [facility]. Alcohol swab area you plan to administer Dexcom then let dry. Once dry, apply Skin Tac in a circular motion (with a spot in the middle for sensor without skin tac) and let dry. Once dry you can apply Dexcom!   Problems taking off Dexcom? 1. Remember to try to shower before removing Dexcom 2. Order Tac Away to help remove any extra adhesive left on your skin once you remove [facility]  Dexcom Customer Service Information 1. Customer Sales Support (dexcom orders and general customer questions) Phone number: [phone] Monday - Friday  6 AM - 5 PM PST Saturday 8 AM - 4 PM PST  *Contact if you do not receive overlay patches  2. Global Technical Support (product troubleshooting or replacement inquiries) Phone number: [phone] Available 24 hours a day; 7 days a week  *Contact if you have a "bad" sensor. Remember to tell them you are wearing Dexcom on your stomach!  3. Dexcom Care (provides dexcom CGM training, software downloads, and tutorials) Phone number: [phone] Monday - Friday 6 AM - 5 PM PST Saturday 7 AM - 1:30 PM PST (All hours subject to change)  4. Website: [email or web address]

## 2019-09-08 NOTE — Progress Notes (Signed)
S:     Chief Complaint  Patient presents with  . Medication Management    Diabetes Lipids    Patient arrives in good spirits ambulating without assistance.  Presents for diabetes management and Dexcom G6 data interpretation. Patient was referred and last seen by Primary Care Provider on Dr. Pilar Plate on 04/05/19. Patient has been followed in PharmD clinic since 12/2018.  She has made significant diet modifications. Started [employer].   Patient is not taking >1 gram acetaminophen every 6 hours: denies Patient is not taking hydroxyrea: denies  Patient reports Diabetes was diagnosed in 2017.   Family/Social History: mother (alcoholic), father (alcoholic)  Insurance coverage/medication affordability: Youth worker   Patient reports adherence with medications.  Current diabetes medications include: Basaglar 20 units daily, Jardiance 10 mg, Ozempic 1 mg weekly (Mondays) Current hypertension medications include: lisinopril 10 mg daily Current hyperlipidemia medications include: atorvastatin 80 mg daily, Zetia 10 mg daily, OTC fish oil   Patient reported dietary habits: Eats 1 meal/day (Optivia diet) Dinner: grilled chicken salad Five "fuelings": (shakes, granola bars, cereal, oatmeal, pancakes  Snacks: ~1 (popcorn) Drinks: water (1 gallon daily), diet soda (very rarely)  Patient-reported exercise habits: denies   Patient reports 2x (at the most) of nocturia (nighttime urination).  Patient denies neuropathy (nerve pain). Patient denies  visual changes. Follows with ophthalmology. Last seen 04/2019. Patient reports self foot exams. She shows there is a cyst on her foot. Follows with podiatrist. Last seen 03/2019    O:  Dexcom G6 Report(08/26/2019 - 09/08/2019) Average Glucose: 92 Glucose Management Indicator (GMI): 5.5%  Time in Range: -Very high: 0% -High: <1% -In range: 95% -Low: 4% -Very low: <1%   Physical Exam Constitutional:      Appearance: She is obese.    Psychiatric:        Mood and Affect: Mood normal.        Behavior: Behavior normal.        Thought Content: Thought content normal.        Judgment: Judgment normal.      Review of Systems  All other systems reviewed and are negative.    Lab Results  Component Value Date   HGBA1C 6.2 09/08/2019   Vitals:   09/08/19 0917  BP: 124/84  Pulse: 82  SpO2: 99%    Lipid Panel     Component Value Date/Time   CHOL 137 09/06/2018 1628   TRIG 214 (H) 09/06/2018 1628   HDL 31 (L) 09/06/2018 1628   CHOLHDL 4.4 09/06/2018 1628   CHOLHDL 6.0 (H) 11/07/2015 1109   VLDL 63 (H) 11/07/2015 1109   LDLCALC 63 09/06/2018 1628   LDLDIRECT 145.1 12/05/2013 1626    A/P:  Diabetes  Diabetes longstanding since 2017  currently EXTREMELY WELL CONTROLLED likely due to recent dietary changes!!! Dexcom G6 report analyzed, which showed patient's BG are within range a majority of the time and are very tightly controlled. She has been experiencing hypoglycemia intermittently.  Patient is able to verbalize appropriate hypoglycemia management plan. Diabetes medication adherence is optimal.  -Decreased dose of basal insulin Basaglar (insulin glargine) from 20 units to 10 units. Plan to discontinue Basaglar completely in the future.  -Continued GLP-1 Ozempic (semaglutide) 1 mg subQ weekly (Mondays).  -Continued SGLT2-I Jardiance (empagliflozin) 10 mg daily. -Continue Dexcom CGM use.  ---Patient has a diagnosis of diabetes, checks blood glucose readings > 4x per day, treats with 1 insulin injection, and requires frequent adjustments to insulin regimen. This  patient will be seen every six months, minimally, to assess adherence to their CGM regimen and diabetes treatment plan. -Extensively discussed pathophysiology of diabetes, recommended lifestyle interventions, dietary effects on blood sugar control -Counseled on s/sx of and management of hypoglycemia -Next A1C anticipated 08/2020.   ASCVD risk  ASCVD  risk - primary prevention in patient with diabetes. Last LDL is controlled. High intensity statin indicated. Aspirin is not indicated. Lipid panel obtained today.  -Continued atorvastatin 80 mg, Zetia 10 mg daily, and OTC fish oil. Will consider discontinuing Zetia and OTC fish oil based on lipid panel results.  HTN  Hypertension longstanding currently controlled.  Blood pressure goal = <130/80 mmHg. Patient antihypertensive medication adherence optimal.   -Continue lisinopril 10 mg daily -Advised patient to monitor blood pressure readings daily. If BP is controlled then can consider discontinuing lisinopril in the future.  Written patient instructions provided.  Total time in face to face counseling 45 minutes.   Follow up Pharmacist in 1 day to review lipid panel results and optimize hyperlipidemia medications.   Patient seen with Esmeralda Links, PharmD Candidate, Richardine Service, PharmD PGY-1 Resident. and Drexel Iha, PharmD, PGY2 Pharmacy Resident.

## 2019-09-08 NOTE — Assessment & Plan Note (Signed)
Diabetes  Diabetes longstanding since 2017  currently EXTREMELY WELL CONTROLLED likely due to recent dietary changes!!! Dexcom G6 report analyzed, which showed patient's BG are within range a majority of the time and are very tightly controlled. She has been experiencing hypoglycemia intermittently.  Patient is able to verbalize appropriate hypoglycemia management plan. Diabetes medication adherence is optimal.  -Decreased dose of basal insulin Basaglar (insulin glargine) from 20 units to 10 units. Plan to discontinue Basaglar completely in the future.  -Continued GLP-1 Ozempic (semaglutide) 1 mg subQ weekly (Mondays).  -Continued SGLT2-I Jardiance (empagliflozin) 10 mg daily. -Continue Dexcom CGM use.  ---Patient has a diagnosis of diabetes, checks blood glucose readings > 4x per day, treats with 1 insulin injection, and requires frequent adjustments to insulin regimen. This patient will be seen every six months, minimally, to assess adherence to their CGM regimen and diabetes treatment plan. -Extensively discussed pathophysiology of diabetes, recommended lifestyle interventions, dietary effects on blood sugar control -Counseled on s/sx of and management of hypoglycemia -Next A1C anticipated 08/2020.

## 2019-09-09 ENCOUNTER — Telehealth: Payer: Self-pay | Admitting: Pharmacist

## 2019-09-09 DIAGNOSIS — E782 Mixed hyperlipidemia: Secondary | ICD-10-CM

## 2019-09-09 LAB — LIPID PANEL
Chol/HDL Ratio: 6.1 ratio — ABNORMAL HIGH (ref 0.0–4.4)
Cholesterol, Total: 176 mg/dL (ref 100–199)
HDL: 29 mg/dL — ABNORMAL LOW (ref >39)
LDL Chol Calc (NIH): 116 mg/dL — ABNORMAL HIGH (ref 0–99)
Triglycerides: 176 mg/dL — ABNORMAL HIGH (ref 0–149)
VLDL Cholesterol Cal: 31 mg/dL (ref 5–40)

## 2019-09-09 MED ORDER — ATORVASTATIN CALCIUM 80 MG PO TABS: 80.0000 mg | ORAL_TABLET | Freq: Every day | ORAL | 3 refills | Status: DC

## 2019-09-09 NOTE — Telephone Encounter (Signed)
Called patient on 09/09/2019 at 12:32 PM   Discussed with patient how LDL had increased from 63 on 09/06/18 to 116 on 09/08/19  Patient was able to go through medication cabinet to confirm that she has been adherent to Zetia 10 mg daily and fish oil. She does not have her atorvastatin 80 mg prescription and is unsure why.  Increase in LDL is likely due to lack of adherence to atorvastatin 80 mg daily.   Plan for patient to restart atorvastatin 80 mg daily (sent new rx to patient's preferred pharmacy) and continue Zetia 10 mg daily. Can discontinue fish oil (TG mildly elevated but not concerning). Advised her to make follow up appointment in 3 months to see complete effect of reinitiating atorvastatin on LDL. Depending on LDL, can consider discontinuing Zetia in the future.   Notified her that Dr. Valentina Lucks will be following up with patient within next few weeks to discuss possibly discontinuing insulin for DM management. If she monitors her BP daily, could considering discontinuing lisinopril depending on BP readings. Patient verbalized understanding.   Thank you for involving pharmacy to assist in providing this patient's care.   Drexel Iha, PharmD PGY2 Ambulatory Care Pharmacy Resident

## 2019-09-17 NOTE — Progress Notes (Signed)
Reviewed: I agree with Dr. Koval's documentation and management. 

## 2019-09-25 ENCOUNTER — Other Ambulatory Visit: Payer: Self-pay | Admitting: Family Medicine

## 2019-10-13 ENCOUNTER — Telehealth: Payer: Self-pay | Admitting: Cardiology

## 2019-10-13 ENCOUNTER — Encounter: Payer: Self-pay | Admitting: Cardiology

## 2019-10-13 ENCOUNTER — Telehealth: Payer: Self-pay

## 2019-10-13 NOTE — Telephone Encounter (Signed)
Faxed successfully via EPIC

## 2019-10-13 NOTE — Telephone Encounter (Signed)
   [facility] Pre-operative Risk Assessment    HEARTCARE STAFF: - Please ensure there is not already an duplicate clearance open for this procedure. - Under Visit Info/Reason for Call, type in Other and utilize the format Clearance MM/DD/YY or Clearance TBD. Do not use dashes or single digits. - If request is for dental extraction, please clarify the # of teeth to be extracted.  Request for surgical clearance:  1. What type of surgery is being performed? Open reduction, internal fixation of left distal radius fracture  When is this surgery scheduled? 10/17/19 2. What type of clearance is required (medical clearance vs. Pharmacy clearance to hold med vs. Both)? Medical (Cardiac)  3. Are there any medications that need to be held prior to surgery and how long? None   4. Practice name and name of physician performing surgery? Dr. Burney Gauze [facility]   What is the office phone number? [phone]  7.   What is the office fax number? [phone]  8.   Anesthesia type (None, local, MAC, general) ? Axillary block with MAC   Holly Richards 10/13/2019, 3:13 PM  _________________________________________________________________   (provider comments below)

## 2019-10-13 NOTE — Telephone Encounter (Signed)
Holly Richards with Dr. Maylene Roes office is calling to inquire about whether or not patient had a CT at [facility]. I am unable to see records from [facility]. Please return call to discuss at [phone].

## 2019-10-13 NOTE — Telephone Encounter (Signed)
Dr. Bertis Ruddy RN calling to request cardiac clearance for an upcoming surgery on Monday Aug 2 for an open reduction. The request is coming from anesthesia who believes pt was supposed to have a cardiac CT per her echo recommendation. A surgical request encounter was sent to PreOp pool under "High" priority with all requested info.   Sending this encounter to the provider to make her aware.

## 2019-10-13 NOTE — Telephone Encounter (Signed)
Per Dr. Claiborne Billings (DOD), pt is cleared for ORIF at low risk. Will fax to requesting provider via Epic fax function and call pt to let her know.

## 2019-10-17 ENCOUNTER — Encounter (HOSPITAL_BASED_OUTPATIENT_CLINIC_OR_DEPARTMENT_OTHER)
Admission: RE | Admit: 2019-10-17 | Discharge: 2019-10-17 | Disposition: A | Discharge status: Home / Self Care | Payer: No Typology Code available for payment source | Source: Ambulatory Visit | Attending: Orthopedic Surgery | Admitting: Orthopedic Surgery

## 2019-10-17 ENCOUNTER — Other Ambulatory Visit: Payer: Self-pay | Admitting: Orthopedic Surgery

## 2019-10-17 ENCOUNTER — Other Ambulatory Visit: Payer: Self-pay

## 2019-10-17 ENCOUNTER — Encounter (HOSPITAL_BASED_OUTPATIENT_CLINIC_OR_DEPARTMENT_OTHER): Payer: Self-pay | Admitting: Orthopedic Surgery

## 2019-10-17 DIAGNOSIS — Z01812 Encounter for preprocedural laboratory examination: Secondary | ICD-10-CM | POA: Diagnosis present

## 2019-10-17 LAB — BASIC METABOLIC PANEL
Anion gap: 10 (ref 5–15)
BUN: 12 mg/dL (ref 6–20)
CO2: 22 mmol/L (ref 22–32)
Calcium: 9.6 mg/dL (ref 8.9–10.3)
Chloride: 108 mmol/L (ref 98–111)
Creatinine, Ser: 0.48 mg/dL (ref 0.44–1.00)
GFR calc Af Amer: >60 mL/min (ref >60)
GFR calc non Af Amer: >60 mL/min (ref >60)
Glucose, Bld: 119 mg/dL — ABNORMAL HIGH (ref 70–99)
Potassium: 4 mmol/L (ref 3.5–5.1)
Sodium: 140 mmol/L (ref 135–145)

## 2019-10-17 NOTE — Progress Notes (Signed)
Dr Lissa Hoard reviewed pt's chart and ECHO results and notes by cards. She is OK to proceed with surgery without further testing.

## 2019-10-18 ENCOUNTER — Other Ambulatory Visit (HOSPITAL_COMMUNITY)
Admission: RE | Admit: 2019-10-18 | Discharge: 2019-10-18 | Disposition: A | Discharge status: Home / Self Care | Payer: No Typology Code available for payment source | Source: Ambulatory Visit | Attending: Orthopedic Surgery | Admitting: Orthopedic Surgery

## 2019-10-18 DIAGNOSIS — Z20822 Contact with and (suspected) exposure to covid-19: Secondary | ICD-10-CM | POA: Insufficient documentation

## 2019-10-18 DIAGNOSIS — Z01812 Encounter for preprocedural laboratory examination: Secondary | ICD-10-CM | POA: Insufficient documentation

## 2019-10-18 LAB — SARS CORONAVIRUS 2 (TAT 6-24 HRS): SARS Coronavirus 2: NEGATIVE

## 2019-10-21 ENCOUNTER — Other Ambulatory Visit: Payer: Self-pay

## 2019-10-21 ENCOUNTER — Encounter (HOSPITAL_BASED_OUTPATIENT_CLINIC_OR_DEPARTMENT_OTHER)
Admission: RE | Disposition: A | Discharge status: Home / Self Care | Payer: Self-pay | Source: Home / Self Care | Attending: Orthopedic Surgery

## 2019-10-21 ENCOUNTER — Encounter (HOSPITAL_BASED_OUTPATIENT_CLINIC_OR_DEPARTMENT_OTHER): Payer: Self-pay | Admitting: Orthopedic Surgery

## 2019-10-21 ENCOUNTER — Ambulatory Visit (HOSPITAL_BASED_OUTPATIENT_CLINIC_OR_DEPARTMENT_OTHER): Payer: No Typology Code available for payment source | Admitting: Certified Registered"

## 2019-10-21 ENCOUNTER — Ambulatory Visit (HOSPITAL_BASED_OUTPATIENT_CLINIC_OR_DEPARTMENT_OTHER)
Admission: RE | Admit: 2019-10-21 | Discharge: 2019-10-21 | Disposition: A | Discharge status: Home / Self Care | Payer: No Typology Code available for payment source | Attending: Orthopedic Surgery | Admitting: Orthopedic Surgery

## 2019-10-21 DIAGNOSIS — E039 Hypothyroidism, unspecified: Secondary | ICD-10-CM | POA: Insufficient documentation

## 2019-10-21 DIAGNOSIS — X58XXXD Exposure to other specified factors, subsequent encounter: Secondary | ICD-10-CM | POA: Insufficient documentation

## 2019-10-21 DIAGNOSIS — F419 Anxiety disorder, unspecified: Secondary | ICD-10-CM | POA: Insufficient documentation

## 2019-10-21 DIAGNOSIS — I10 Essential (primary) hypertension: Secondary | ICD-10-CM | POA: Insufficient documentation

## 2019-10-21 DIAGNOSIS — Z811 Family history of alcohol abuse and dependence: Secondary | ICD-10-CM | POA: Insufficient documentation

## 2019-10-21 DIAGNOSIS — Z794 Long term (current) use of insulin: Secondary | ICD-10-CM | POA: Diagnosis not present

## 2019-10-21 DIAGNOSIS — Z79899 Other long term (current) drug therapy: Secondary | ICD-10-CM | POA: Insufficient documentation

## 2019-10-21 DIAGNOSIS — Q969 Turner's syndrome, unspecified: Secondary | ICD-10-CM | POA: Diagnosis not present

## 2019-10-21 DIAGNOSIS — Z947 Corneal transplant status: Secondary | ICD-10-CM | POA: Insufficient documentation

## 2019-10-21 DIAGNOSIS — Z8349 Family history of other endocrine, nutritional and metabolic diseases: Secondary | ICD-10-CM | POA: Insufficient documentation

## 2019-10-21 DIAGNOSIS — Z888 Allergy status to other drugs, medicaments and biological substances status: Secondary | ICD-10-CM | POA: Insufficient documentation

## 2019-10-21 DIAGNOSIS — Z8489 Family history of other specified conditions: Secondary | ICD-10-CM | POA: Diagnosis not present

## 2019-10-21 DIAGNOSIS — Z9049 Acquired absence of other specified parts of digestive tract: Secondary | ICD-10-CM | POA: Diagnosis not present

## 2019-10-21 DIAGNOSIS — G43909 Migraine, unspecified, not intractable, without status migrainosus: Secondary | ICD-10-CM | POA: Diagnosis not present

## 2019-10-21 DIAGNOSIS — S52502P Unspecified fracture of the lower end of left radius, subsequent encounter for closed fracture with malunion: Secondary | ICD-10-CM | POA: Insufficient documentation

## 2019-10-21 DIAGNOSIS — Z8249 Family history of ischemic heart disease and other diseases of the circulatory system: Secondary | ICD-10-CM | POA: Diagnosis not present

## 2019-10-21 DIAGNOSIS — F319 Bipolar disorder, unspecified: Secondary | ICD-10-CM | POA: Insufficient documentation

## 2019-10-21 DIAGNOSIS — Z833 Family history of diabetes mellitus: Secondary | ICD-10-CM | POA: Insufficient documentation

## 2019-10-21 DIAGNOSIS — E119 Type 2 diabetes mellitus without complications: Secondary | ICD-10-CM | POA: Diagnosis not present

## 2019-10-21 HISTORY — DX: Type 2 diabetes mellitus without complications: E11.9

## 2019-10-21 HISTORY — DX: Turner's syndrome, unspecified: Q96.9

## 2019-10-21 HISTORY — PX: OPEN REDUCTION INTERNAL FIXATION (ORIF) DISTAL RADIAL FRACTURE: SHX5989

## 2019-10-21 HISTORY — DX: Hypothyroidism, unspecified: E03.9

## 2019-10-21 HISTORY — DX: Unspecified fracture of the lower end of left radius, initial encounter for closed fracture: S52.502A

## 2019-10-21 HISTORY — PX: WRIST OSTEOTOMY: SHX1093

## 2019-10-21 LAB — GLUCOSE, CAPILLARY
Glucose-Capillary: 98 mg/dL (ref 70–99)
Glucose-Capillary: 138 mg/dL — ABNORMAL HIGH (ref 70–99)

## 2019-10-21 SURGERY — OPEN REDUCTION INTERNAL FIXATION (ORIF) DISTAL RADIUS FRACTURE
Anesthesia: Regional | Site: Wrist | Laterality: Left

## 2019-10-21 MED ORDER — ONDANSETRON HCL 4 MG/2ML IJ SOLN
INTRAMUSCULAR | Status: DC | PRN
  Administered 2019-10-21: 4 mg via INTRAVENOUS

## 2019-10-21 MED ORDER — MIDAZOLAM HCL 2 MG/2ML IJ SOLN
INTRAMUSCULAR | Status: AC
  Filled 2019-10-21: qty 2

## 2019-10-21 MED ORDER — FENTANYL CITRATE (PF) 100 MCG/2ML IJ SOLN
INTRAMUSCULAR | Status: DC | PRN
  Administered 2019-10-21 (×2): 50 ug via INTRAVENOUS

## 2019-10-21 MED ORDER — FENTANYL CITRATE (PF) 100 MCG/2ML IJ SOLN
INTRAMUSCULAR | Status: AC
  Filled 2019-10-21: qty 2

## 2019-10-21 MED ORDER — ONDANSETRON HCL 4 MG/2ML IJ SOLN
INTRAMUSCULAR | Status: AC
  Filled 2019-10-21: qty 2

## 2019-10-21 MED ORDER — PHENYLEPHRINE HCL (PRESSORS) 10 MG/ML IV SOLN
INTRAVENOUS | Status: DC | PRN
Start: 2019-10-21 — End: 2019-10-21
  Administered 2019-10-21: 120 ug via INTRAVENOUS

## 2019-10-21 MED ORDER — MEPERIDINE HCL 25 MG/ML IJ SOLN: 6.2500 mg | INTRAMUSCULAR | Status: DC | PRN

## 2019-10-21 MED ORDER — AMISULPRIDE (ANTIEMETIC) 5 MG/2ML IV SOLN
INTRAVENOUS | Status: AC
  Filled 2019-10-21: qty 4

## 2019-10-21 MED ORDER — AMISULPRIDE (ANTIEMETIC) 5 MG/2ML IV SOLN
10.0000 mg | Freq: Once | INTRAVENOUS | Status: AC
  Administered 2019-10-21: 10 mg via INTRAVENOUS

## 2019-10-21 MED ORDER — CEFAZOLIN SODIUM-DEXTROSE 2-4 GM/100ML-% IV SOLN
INTRAVENOUS | Status: AC
  Filled 2019-10-21: qty 100

## 2019-10-21 MED ORDER — CEFAZOLIN SODIUM-DEXTROSE 2-4 GM/100ML-% IV SOLN
2.0000 g | INTRAVENOUS | Status: AC
  Administered 2019-10-21: 2 g via INTRAVENOUS

## 2019-10-21 MED ORDER — HYDROMORPHONE HCL 1 MG/ML IJ SOLN: 0.2500 mg | INTRAMUSCULAR | Status: DC | PRN

## 2019-10-21 MED ORDER — PROPOFOL 10 MG/ML IV BOLUS
INTRAVENOUS | Status: DC | PRN
  Administered 2019-10-21: 50 mg via INTRAVENOUS

## 2019-10-21 MED ORDER — KETOROLAC TROMETHAMINE 30 MG/ML IJ SOLN
INTRAMUSCULAR | Status: AC
  Filled 2019-10-21: qty 1

## 2019-10-21 MED ORDER — LACTATED RINGERS IV SOLN: INTRAVENOUS | Status: DC

## 2019-10-21 MED ORDER — FENTANYL CITRATE (PF) 100 MCG/2ML IJ SOLN
100.0000 ug | Freq: Once | INTRAMUSCULAR | Status: AC
  Administered 2019-10-21: 100 ug via INTRAVENOUS

## 2019-10-21 MED ORDER — ONDANSETRON HCL 4 MG/2ML IJ SOLN
4.0000 mg | Freq: Once | INTRAMUSCULAR | Status: AC | PRN
  Administered 2019-10-21: 4 mg via INTRAVENOUS

## 2019-10-21 MED ORDER — KETOROLAC TROMETHAMINE 30 MG/ML IJ SOLN
30.0000 mg | Freq: Once | INTRAMUSCULAR | Status: AC
  Administered 2019-10-21: 30 mg via INTRAVENOUS

## 2019-10-21 MED ORDER — OXYCODONE-ACETAMINOPHEN 5-325 MG PO TABS: ORAL_TABLET | ORAL | 0 refills | Status: DC

## 2019-10-21 MED ORDER — BUPIVACAINE-EPINEPHRINE (PF) 0.5% -1:200000 IJ SOLN
INTRAMUSCULAR | Status: DC | PRN
  Administered 2019-10-21: 30 mL via PERINEURAL

## 2019-10-21 MED ORDER — MIDAZOLAM HCL 2 MG/2ML IJ SOLN
2.0000 mg | Freq: Once | INTRAMUSCULAR | Status: AC
  Administered 2019-10-21: 2 mg via INTRAVENOUS

## 2019-10-21 MED ORDER — PROPOFOL 500 MG/50ML IV EMUL
INTRAVENOUS | Status: DC | PRN
  Administered 2019-10-21: 100 ug/kg/min via INTRAVENOUS

## 2019-10-21 SURGICAL SUPPLY — 65 items
APL PRP STRL LF DISP 70% ISPRP (MISCELLANEOUS) ×1
BIT DRILL 2.0 LNG QUCK RELEASE (BIT) IMPLANT
BIT DRILL 2.8 QUICK RELEASE (BIT) IMPLANT
BLADE AVERAGE 25MMX9MM (BLADE)
BLADE AVERAGE 25X9 (BLADE) IMPLANT
BLADE SURG 15 STRL LF DISP TIS (BLADE) ×2 IMPLANT
BLADE SURG 15 STRL SS (BLADE) ×6
BNDG CMPR 9X4 STRL LF SNTH (GAUZE/BANDAGES/DRESSINGS) ×1
BNDG ELASTIC 3X5.8 VLCR STR LF (GAUZE/BANDAGES/DRESSINGS) ×3 IMPLANT
BNDG ESMARK 4X9 LF (GAUZE/BANDAGES/DRESSINGS) ×3 IMPLANT
BNDG GAUZE ELAST 4 BULKY (GAUZE/BANDAGES/DRESSINGS) ×3 IMPLANT
BNDG PLASTER X FAST 3X3 WHT LF (CAST SUPPLIES) ×30 IMPLANT
BNDG PLSTR 9X3 FST ST WHT (CAST SUPPLIES) ×10
BONE CHIP PRESERV 5CC PCAN5 (Bone Implant) ×3 IMPLANT
CHLORAPREP W/TINT 26 (MISCELLANEOUS) ×3 IMPLANT
CORD BIPOLAR FORCEPS 12FT (ELECTRODE) ×3 IMPLANT
COVER BACK TABLE 60X90IN (DRAPES) ×3 IMPLANT
COVER MAYO STAND STRL (DRAPES) ×3 IMPLANT
COVER WAND RF STERILE (DRAPES) IMPLANT
CUFF TOURN SGL QUICK 18X4 (TOURNIQUET CUFF) ×2 IMPLANT
CUFF TOURN SGL QUICK 24 (TOURNIQUET CUFF)
CUFF TRNQT CYL 24X4X16.5-23 (TOURNIQUET CUFF) IMPLANT
DRAPE EXTREMITY T 121X128X90 (DISPOSABLE) ×3 IMPLANT
DRAPE OEC MINIVIEW 54X84 (DRAPES) ×3 IMPLANT
DRAPE SURG 17X23 STRL (DRAPES) ×3 IMPLANT
DRILL 2.0 LNG QUICK RELEASE (BIT) ×3
DRILL 2.8 QUICK RELEASE (BIT) ×3
GAUZE SPONGE 4X4 12PLY STRL (GAUZE/BANDAGES/DRESSINGS) ×3 IMPLANT
GAUZE XEROFORM 1X8 LF (GAUZE/BANDAGES/DRESSINGS) ×3 IMPLANT
GLOVE BIO SURGEON STRL SZ7.5 (GLOVE) ×3 IMPLANT
GLOVE BIOGEL PI IND STRL 8 (GLOVE) ×1 IMPLANT
GLOVE BIOGEL PI IND STRL 8.5 (GLOVE) IMPLANT
GLOVE BIOGEL PI INDICATOR 8 (GLOVE) ×2
GLOVE BIOGEL PI INDICATOR 8.5 (GLOVE) ×2
GLOVE SURG ORTHO 8.0 STRL STRW (GLOVE) ×2 IMPLANT
GOWN STRL REUS W/ TWL LRG LVL3 (GOWN DISPOSABLE) ×1 IMPLANT
GOWN STRL REUS W/TWL LRG LVL3 (GOWN DISPOSABLE) ×3
GOWN STRL REUS W/TWL XL LVL3 (GOWN DISPOSABLE) ×3 IMPLANT
GRAFT BNE CANC CHIPS 1-8 5CC (Bone Implant) IMPLANT
GUIDEWIRE ORTHO 0.054X6 (WIRE) ×6 IMPLANT
NDL HYPO 25X1 1.5 SAFETY (NEEDLE) IMPLANT
NEEDLE HYPO 25X1 1.5 SAFETY (NEEDLE) IMPLANT
NS IRRIG 1000ML POUR BTL (IV SOLUTION) ×3 IMPLANT
PACK BASIN DAY SURGERY FS (CUSTOM PROCEDURE TRAY) ×3 IMPLANT
PAD CAST 3X4 CTTN HI CHSV (CAST SUPPLIES) ×1 IMPLANT
PADDING CAST COTTON 3X4 STRL (CAST SUPPLIES) ×3
PLATE ACU LOC PROX STD LEFT (Plate) ×2 IMPLANT
SCREW BN FT 16X2.3XLCK HEX CRT (Screw) IMPLANT
SCREW CORT FT 18X2.3XLCK HEX (Screw) IMPLANT
SCREW CORTICAL LOCKING 2.3X16M (Screw) ×3 IMPLANT
SCREW CORTICAL LOCKING 2.3X18M (Screw) ×12 IMPLANT
SCREW CORTICAL LOCKING 2.3X20M (Screw) ×3 IMPLANT
SCREW FX18X2.3XSMTH LCK NS CRT (Screw) IMPLANT
SCREW FX20X2.3XSMTH LCK NS CRT (Screw) IMPLANT
SCREW HEXALOBE NON-LOCK 3.5X14 (Screw) ×2 IMPLANT
SCREW NON LOCK 3.5X10MM (Screw) ×2 IMPLANT
SCREW NONLOCK HEX 3.5X12 (Screw) ×2 IMPLANT
SLEEVE SCD COMPRESS KNEE MED (MISCELLANEOUS) ×2 IMPLANT
STOCKINETTE 4X48 STRL (DRAPES) ×3 IMPLANT
SUT ETHILON 4 0 PS 2 18 (SUTURE) ×3 IMPLANT
SUT VICRYL 4-0 PS2 18IN ABS (SUTURE) ×3 IMPLANT
SYR BULB EAR ULCER 3OZ GRN STR (SYRINGE) ×3 IMPLANT
SYR CONTROL 10ML LL (SYRINGE) IMPLANT
TOWEL GREEN STERILE FF (TOWEL DISPOSABLE) ×6 IMPLANT
UNDERPAD 30X36 HEAVY ABSORB (UNDERPADS AND DIAPERS) ×3 IMPLANT

## 2019-10-21 NOTE — Anesthesia Procedure Notes (Signed)
Anesthesia Regional Block: Supraclavicular block   Pre-Anesthetic Checklist: ,, timeout performed, Correct Patient, Correct Site, Correct Laterality, Correct Procedure, Correct Position, site marked, Risks and benefits discussed,  Surgical consent,  Pre-op evaluation,  At surgeon's request and post-op pain management  Laterality: Left  Prep: chloraprep       Needles:   Needle Type: Echogenic Stimulator Needle     Needle Length: 9cm  Needle Gauge: 21     Additional Needles:   Procedures:, nerve stimulator,,,,,,,   Nerve Stimulator or Paresthesia:  Response: 0.4 mA,   Additional Responses:   Narrative:  Start time: 10/21/2019 1:26 PM End time: 10/21/2019 1:36 PM Injection made incrementally with aspirations every 5 mL.  Performed by: Personally  Anesthesiologist: Lillia Abed, MD  Additional Notes: Monitors applied. Patient sedated. Sterile prep and drape,hand hygiene and sterile gloves were used. Relevant anatomy identified.Needle position confirmed.Local anesthetic injected incrementally after negative aspiration. Local anesthetic spread visualized around nerve(s). Vascular puncture avoided. No complications. Image printed for medical record.The patient tolerated the procedure well.

## 2019-10-21 NOTE — Anesthesia Postprocedure Evaluation (Signed)
Anesthesia Post Note  Patient: [name]  Procedure(s) Performed: OPEN REDUCTION INTERNAL FIXATION (ORIF) DISTAL RADIAL FRACTURE WITH BONE GRAFT (Left Wrist) DISTAL RADIUS OSTEOTOMY LEFT (Left Wrist)     Patient location during evaluation: PACU Anesthesia Type: Regional Level of consciousness: awake and alert Pain management: pain level controlled Vital Signs Assessment: post-procedure vital signs reviewed and stable Respiratory status: spontaneous breathing, nonlabored ventilation, respiratory function stable and patient connected to nasal cannula oxygen Cardiovascular status: blood pressure returned to baseline and stable Postop Assessment: no apparent nausea or vomiting Anesthetic complications: no   No complications documented.  Last Vitals:  Vitals:   10/21/19 1715 10/21/19 1730  BP: 133/85 126/83  Pulse: 76 83  Resp: 16 16  Temp:  36.7 C  SpO2: 97% 96%    Last Pain:  Vitals:   10/21/19 1730  TempSrc:   PainSc: 3                  [name]

## 2019-10-21 NOTE — H&P (Signed)
Holly Richards is an 39 y.o. female.   Chief Complaint: left wrist fracture HPI: 39 yo female states she injured left wrist ~ August 26, 2019.  Seen at UC 3-4 weeks later where XR revealed distal radius fracture.  Splinted and referred for care.  XR show dorsal angulation.  She wishes to proceed with operative osteotomy and fixation.  Allergies:  Allergies  Allergen Reactions  . Oseltamivir Phosphate Anaphylaxis    Past Medical History:  Diagnosis Date  . Anxiety   . Bipolar disorder ([place])   . Closed fracture of left distal radius   . Diabetes mellitus without complication (HCC)    Type 2, Insulin dependent  . Hypertension   . Hypothyroidism   . Migraines   . Thyroid disease   . Turner syndrome     Past Surgical History:  Procedure Laterality Date  . CHOLECYSTECTOMY    . CHOLECYSTECTOMY  2008  . COLONOSCOPY    . CORNEAL TRANSPLANT      Family History: Family History  Problem Relation Age of Onset  . Autoimmune disease Mother   . Alcohol abuse Father   . Hyperlipidemia Father   . Hypertension Father   . Amenorrhea Brother   . Diabetes Maternal Grandmother   . Diabetes Maternal Grandfather     Social History:   reports that she has never smoked. She has never used smokeless tobacco. She reports current alcohol use. She reports that she does not use drugs.  Medications: Medications Prior to Admission  Medication Sig Dispense Refill  . atorvastatin (LIPITOR) 80 MG tablet Take 1 tablet (80 mg total) by mouth daily. 90 tablet 3  . Blood Glucose Monitoring Suppl (ONETOUCH VERIO) w/Device KIT 1 kit by Does not apply route daily. 1 kit 0  . Continuous Blood Gluc Receiver (DEXCOM G6 RECEIVER) DEVI 1 Product by Other route as directed. 1 Product 11  . Continuous Blood Gluc Sensor (DEXCOM G6 SENSOR) MISC Inject 1 applicator into the skin as directed. Change sensor every 10 days. 3 each 11  . Continuous Blood Gluc Transmit (DEXCOM G6 TRANSMITTER) MISC Inject 1 Device into the  skin as directed. Change transmitter every 90 days (will reuse 9 times) 1 each 3  . ezetimibe (ZETIA) 10 MG tablet Take 1 tablet (10 mg total) by mouth daily. 90 tablet 3  . Insulin Glargine (BASAGLAR KWIKPEN) 100 UNIT/ML INJECT 0.2 MLS (20 UNITS TOTAL) INTO THE SKIN DAILY. INSURANCE 1BOX/30 DAYS (Patient taking differently: 10 Units. ) 15 pen 3  . Insulin Pen Needle (PEN NEEDLES) 31G X 8 MM MISC Use as directed 100 each 11  . JARDIANCE 10 MG TABS tablet TAKE 1 TABLET BY MOUTH ONCE A DAY 90 tablet 1  . levothyroxine (SYNTHROID) 88 MCG tablet TAKE 1 TABLET (88 MCG TOTAL) BY MOUTH EVERY MORNING. 30 MINUTES BEFORE FOOD 90 tablet 0  . lisinopril (ZESTRIL) 10 MG tablet Take 1 tablet (10 mg total) by mouth daily. 90 tablet 0  . Multiple Vitamins-Minerals (MULTIVITAMIN PO) Take 1 tablet by mouth daily.    Marland Kitchen OZEMPIC, 1 MG/DOSE, 2 MG/1.5ML SOPN INJECT 1 MG INTO THE SKIN ONCE A WEEK. 3 pen 3  . dicyclomine (BENTYL) 20 MG tablet Take 20 mg by mouth every 6 (six) hours as needed.  0  . Lancet Device MISC Use with glucometer 1 each 0    Results for orders placed or performed during the hospital encounter of 10/21/19 (from the past 48 hour(s))  Glucose, capillary  Status: None   Collection Time: 10/21/19 12:28 PM  Result Value Ref Range   Glucose-Capillary 98 70 - 99 mg/dL    Comment: Glucose reference range applies only to samples taken after fasting for at least 8 hours.    No results found.   A comprehensive review of systems was negative.  Blood pressure (!) 136/92, pulse 87, temperature (!) 97.4 F (36.3 C), temperature source Oral, resp. rate 13, height _0  (1.575 m), weight 83.8 kg, last menstrual period 10/28/2006, SpO2 100 %.  General appearance: alert, cooperative and appears stated age Head: Normocephalic, without obvious abnormality, atraumatic Neck: supple, symmetrical, trachea midline Cardio: regular rate and rhythm Resp: clear to auscultation bilaterally Extremities: Intact  sensation and capillary refill all digits.  +epl/fpl/io.  No wounds.  Pulses: 2+ and symmetric Skin: Skin color, texture, turgor normal. No rashes or lesions Neurologic: Grossly normal Incision/Wound: none  Assessment/Plan Left distal radius malunion.  Plan osteotomy with fixation and likely bone grafting.  Non operative and operative treatment options have been discussed with the patient and patient wishes to proceed with operative treatment. Risks, benefits, and alternatives of surgery have been discussed and the patient agrees with the plan of care.   Leanora Cover 10/21/2019, 1:17 PM

## 2019-10-21 NOTE — Op Note (Signed)
I assisted Surgeon(s) and Role:    * Leanora Cover, MD - Primary    Daryll Brod, MD on the Procedure(s): OPEN REDUCTION INTERNAL FIXATION (ORIF) DISTAL RADIAL FRACTURE WITH BONE GRAFT DISTAL RADIUS OSTEOTOMY LEFT on 10/21/2019.  I provided assistance on this case as follows: set up, approach,identification of the malunion site, osteotomy of the distal radius,bone grafting the distal radius, reduction,atabilization and fixation of the radius with plate and screws, closure of the incision and application of the dressings and splint. Electronically signed by: Daryll Brod, MD Date: 10/21/2019 Time: 4:18 PM

## 2019-10-21 NOTE — Anesthesia Preprocedure Evaluation (Signed)
Anesthesia Evaluation  Patient identified by MRN, date of birth, ID band Patient awake    Reviewed: Allergy & Precautions, NPO status , Patient's Chart, lab work & pertinent test results  Airway Mallampati: I  TM Distance: >3 FB Neck ROM: Full    Dental   Pulmonary    Pulmonary exam normal        Cardiovascular hypertension, Pt. on medications Normal cardiovascular exam     Neuro/Psych Anxiety Bipolar Disorder    GI/Hepatic   Endo/Other  diabetes, Type 2, Insulin Dependent  Renal/GU      Musculoskeletal   Abdominal   Peds  Hematology   Anesthesia Other Findings   Reproductive/Obstetrics                             Anesthesia Physical Anesthesia Plan  ASA: II  Anesthesia Plan: Regional   Post-op Pain Management:    Induction: Intravenous  PONV Risk Score and Plan: 2 and Treatment may vary due to age or medical condition and Ondansetron  Airway Management Planned: Nasal Cannula  Additional Equipment:   Intra-op Plan:   Post-operative Plan:   Informed Consent: I have reviewed the patients History and Physical, chart, labs and discussed the procedure including the risks, benefits and alternatives for the proposed anesthesia with the patient or authorized representative who has indicated his/her understanding and acceptance.       Plan Discussed with: CRNA and Surgeon  Anesthesia Plan Comments:         Anesthesia Quick Evaluation

## 2019-10-21 NOTE — Op Note (Signed)
10/21/2019 [facility]  Operative Note  Pre Op Diagnosis: Left distal radius malunion  Post Op Diagnosis: Left distal radius malunion  Procedure:  1. Left distal radius osteotomy 2. ORIF left distal radius 3. Left brachioradialis release  Surgeon: Leanora Cover, MD  Assistant: Daryll Brod, MD  Anesthesia: General with regional  Fluids: Per anesthesia flow sheet  EBL: minimal  Complications: None  Specimen: None  Tourniquet Time:  Total Tourniquet Time Documented: Upper Arm (Left) - 72 minutes Total: Upper Arm (Left) - 72 minutes   Disposition: Stable to PACU  INDICATIONS:  Holly Richards is a 39 y.o. female states she fell approximately June 11 injuring her left wrist.  She was seen at an urgent care 3 to 4 weeks later where radiographs were taken revealing a distal radius fracture.  She was referred for further care.  Radiographs show distal radius malunion with dorsal angulation. We discussed nonoperative and operative treatment options.  She wished to proceed with operative osteotomy and fixation.  Risks, benefits, and alternatives of surgery were discussed including the risk of blood loss; infection; damage to nerves, vessels, tendons, ligaments, bone; failure of surgery; need for additional surgery; complications with wound healing; continued pain; nonunion; malunion; stiffness.  We also discussed the possible need for bone graft and the benefits and risks including the possibility of disease transmission.  She voiced understanding of these risks and elected to proceed.    OPERATIVE COURSE:  After being identified preoperatively by myself, the patient and I agreed upon the procedure and site of procedure.  Surgical site was marked.  The risks, benefits and alternatives of the surgery were reviewed and she wished to proceed.  Surgical consent had been signed.  She was given IV Ancef as preoperative antibiotic prophylaxis.  She was transferred to the operating room  and placed on the operating room table in supine position with the Left Left upper extremity on an armboard. Sedation was induced by the anesthesiologist.  A regional block had been performed by anesthesia in preoperative holding.  This was converted to general to achieve adequate anesthesia.  The Left upper extremity was prepped and draped in normal sterile orthopedic fashion.  A surgical pause was performed between the surgeons, anesthesia and operating room staff, and all were in agreement as to the patient, procedure and site of procedure.  Tourniquet at the proximal aspect of the extremity was inflated to 250 mmHg after exsanguination of the limb with an Esmarch bandage.  Standard volar Mallie Mussel approach was used.  The bipolar electrocautery was used to obtain hemostasis.  The superficial and deep portions of the FCR tendon sheath were incised, and the FCR and FPL were swept ulnarly to protect the palmar cutaneous branch of the median nerve.  The brachioradialis was released at the radial side of the radius.  The pronator quadratus was released and elevated with the periosteal elevator.  The malunion site was identified.  2 K wires were placed in the location of the planned osteotomy.  C-arm was used in AP and lateral projections to ensure appropriate positioning of these K wires which was the case.  The course of the osteotomy was mapped out using a K wire to drill into the volar and dorsal cortices.  The osteotomy was completed using osteotomes.  The dorsal periosteum was released to allow appropriate reduction.  C-arm was used in AP and lateral projections to ensure appropriate reduction was achievable.  This left a cavity at the radius.  Cancellus bone chips were used to fill the cavity.  An AcuMed volar distal radial locking plate was selected.  It was secured to the bone with the guidepins.  C-arm was used in AP and lateral projections to ensure appropriate reduction and position of the hardware and  adjustments made as necessary.  There is good restoration of radial length and volar tilt.  There was improvement in the radial inclination.  Standard AO drilling and measuring technique was used.  A single screw was placed in the slotted hole in the shaft of the plate.  The distal holes were filled with locking pegs with the exception of the styloid holes, which were filled with locking screws.  The remaining holes in the shaft of the plate were filled with nonlocking screws.  Good purchase was obtained.  C-arm was used in AP, lateral and oblique projections to ensure appropriate reduction and position of hardware, which was the case.  Again there was good restoration of radial length and volar tilt.  Radial inclination was improved.  There was no intra-articular penetration of hardware.  The wound was copiously irrigated with sterile saline.  Pronator quadratus was repaired back over top of the plate using 4-0 Vicryl suture.  Vicryl suture was placed in the subcutaneous tissues in an inverted interrupted fashion and the skin was closed with 4-0 nylon in a horizontal mattress fashion.  There was good pronation and supination of the wrist without crepitance.  The wound was then dressed with sterile Xeroform, 4x4s, and wrapped with a Kerlix bandage.  A volar splint was placed and wrapped with Kerlix and Ace bandage.  Tourniquet was deflated at 72 minutes.  Fingertips were pink with brisk capillary refill after deflation of the tourniquet.  Operative drapes were broken down.  The patient was awoken from anesthesia safely.  She was transferred back to the stretcher and taken to the PACU in stable condition.  I will see her back in the office in one week for postoperative followup.  I will give her a prescription for Percocet 5/325 1-2 tabs PO q6 hours prn pain, dispense # 30.    Leanora Cover, MD Electronically signed, 10/21/19

## 2019-10-21 NOTE — Anesthesia Procedure Notes (Signed)
Procedure Name: LMA Insertion Performed by: Verita Lamb, CRNA Pre-anesthesia Checklist: Patient identified, Emergency Drugs available, Suction available and Patient being monitored Patient Re-evaluated:Patient Re-evaluated prior to induction Oxygen Delivery Method: Circle system utilized Preoxygenation: Pre-oxygenation with 100% oxygen Induction Type: IV induction Ventilation: Mask ventilation without difficulty LMA: LMA inserted LMA Size: 3.0 Tube type: Oral Number of attempts: 1 Airway Equipment and Method: Bite block Placement Confirmation: positive ETCO2,  CO2 detector and breath sounds checked- equal and bilateral Tube secured with: Tape Dental Injury: Teeth and Oropharynx as per pre-operative assessment

## 2019-10-21 NOTE — Transfer of Care (Signed)
Immediate Anesthesia Transfer of Care Note  Patient: Holly Richards  Procedure(s) Performed: OPEN REDUCTION INTERNAL FIXATION (ORIF) DISTAL RADIAL FRACTURE WITH BONE GRAFT (Left Wrist) DISTAL RADIUS OSTEOTOMY LEFT (Left Wrist)  Patient Location: PACU  Anesthesia Type:General and Regional  Level of Consciousness: awake, alert  and oriented  Airway & Oxygen Therapy: Patient Spontanous Breathing and Patient connected to face mask oxygen  Post-op Assessment: report to rn, vss  Post vital signs: Reviewed and stable  Last Vitals:  Vitals Value Taken Time  BP    Temp    Pulse 102 10/21/19 1623  Resp    SpO2 97 % 10/21/19 1623  Vitals shown include unvalidated device data.  Last Pain:  Vitals:   10/21/19 1237  TempSrc: Oral  PainSc: 0-No pain      Patients Stated Pain Goal: 3 (15/40/08 6761)  Complications: No complications documented.

## 2019-10-21 NOTE — Progress Notes (Signed)
Assisted Dr. Conrad Rankin with left, ultrasound guided, supraclavicular block. Side rails up, monitors on throughout procedure. See vital signs in flow sheet. Tolerated Procedure well.

## 2019-10-21 NOTE — Discharge Instructions (Addendum)

## 2019-10-24 ENCOUNTER — Encounter (HOSPITAL_BASED_OUTPATIENT_CLINIC_OR_DEPARTMENT_OTHER): Payer: Self-pay | Admitting: Orthopedic Surgery

## 2019-10-28 DIAGNOSIS — S52502P Unspecified fracture of the lower end of left radius, subsequent encounter for closed fracture with malunion: Secondary | ICD-10-CM | POA: Insufficient documentation

## 2019-12-08 ENCOUNTER — Ambulatory Visit: Payer: No Typology Code available for payment source | Admitting: Cardiology

## 2020-01-02 ENCOUNTER — Other Ambulatory Visit: Payer: Self-pay

## 2020-01-02 ENCOUNTER — Ambulatory Visit (INDEPENDENT_AMBULATORY_CARE_PROVIDER_SITE_OTHER): Payer: No Typology Code available for payment source | Admitting: Family Medicine

## 2020-01-02 ENCOUNTER — Encounter: Payer: Self-pay | Admitting: Family Medicine

## 2020-01-02 VITALS — BP 120/90 | HR 87 | Ht 62.0 in | Wt 174.1 lb

## 2020-01-02 DIAGNOSIS — E039 Hypothyroidism, unspecified: Secondary | ICD-10-CM | POA: Diagnosis not present

## 2020-01-02 DIAGNOSIS — E119 Type 2 diabetes mellitus without complications: Secondary | ICD-10-CM | POA: Diagnosis not present

## 2020-01-02 DIAGNOSIS — E782 Mixed hyperlipidemia: Secondary | ICD-10-CM

## 2020-01-02 LAB — POCT GLYCOSYLATED HEMOGLOBIN (HGB A1C): HbA1c, POC (prediabetic range): 5.8 % (ref 5.7–6.4)

## 2020-01-02 NOTE — Patient Instructions (Addendum)
Thank you for coming in to see Holly Richards today! Please see below to review our plan for today's visit:  [name]!!!  1. You can discontinue Lisinopril, Lantus (Basaglar), Semaglutide and Jardiance. We will follow up with A1c to have a final decision on what you can discontinue.  2. I will get back to you on the Lipid Panel.  3. I will contact you with results and we will go from there!   Please call the clinic at [phone] if your symptoms worsen or you have any concerns. It was our pleasure to serve you!   Dr. Milus Banister [facility]

## 2020-01-02 NOTE — Assessment & Plan Note (Signed)
Patient's HbA1c today 5.8%!  This is without any medication, slowly with dietary and lifestyle modification. -Feel patient can continue without insulin glargine, Ozempic, Jardiance, Dexcom. -Strongly encouraged to continue her dietary and lifestyle modifications

## 2020-01-02 NOTE — Assessment & Plan Note (Signed)
Patient continues to take Synthroid 88 mcg daily.  She is hopeful that she can decrease the amount of Synthroid she is taking, however I explained to her that she will most likely have to continue taking the medication. -Checking TSH

## 2020-01-02 NOTE — Progress Notes (Signed)
SUBJECTIVE:   CHIEF COMPLAINT / HPI:   Diabetes: Pleasant patient presents to clinic today for A1c check, she reports she would like to stop taking most if not all of her medications.  She was diagnosed with diabetes in 2017.  Patient was last seen for diabetes management in June 2021 by the pharmacy team.  At the time diabetes was extremely well controlled with HbA1c of 6.2% (previously had been higher than 12%).  Patient had made recent dietary changes at the time to control her diabetes as well.  At the time she was also using Dexcom, insulin glargine (was well to reduce from 20 units daily to 10 units daily), Ozempic and Jardiance.  Today the patient reports she has not used any of her medications (Dexcom, insulin glargine, Ozempic, or Jardiance) in at least 1 month.  Continue to occasionally check her blood sugars and they run about 111.  She denies any episodes of Hypoglycemia.  Her HbA1c today is 5.8%!  Hyperlipidemia  hypertriglyceridemia: Patient has a history of significantly elevated triglycerides as high as 1200 in the past.  Patient has been on atorvastatin 80 and Zetia 10 mg daily.  Was also previously on over-the-counter fish oil but states she has not taken this since June.  She reports that she last took Lipitor and Zetia about 1 month ago. Discontinued fish oil many weeks ago.   HTN: Patient with history of high blood pressure for which she takes lisinopril 10 mg.  Her blood pressure today is 120/90, she reports that she has not taken her lisinopril Hypertension longstanding currently controlled.  Blood pressure goal = <130/80 mmHg. She did not take Lisinopril 10mg  this AM and her BP is at goal 120/90.  Hypothyroidism: Patient taking Synthroid 88 mcg daily.  She would also like to stop taking this medication.  Denies any changes to her hair, skin changes, or GI upset.  Weight: from 205lbs (January 2021) down to 174 lbs today, 31-lb intentional weight loss! On [employer] diet,  continues compliance.  Health maintenance: Patient due for eye exam, flu vaccine, Covid vaccine, and pneumonia vaccine  PERTINENT  PMH / PSH:  Patient Active Problem List   Diagnosis Date Noted  . Atrophic vaginitis 04/05/2019  . Mixed hyperlipidemia 09/06/2018  . Subclinical hypothyroidism 09/17/2015  . Liver enzyme elevation 09/17/2015  . Type 2 diabetes mellitus without complication, without long-term current use of insulin ([place]) 09/12/2015  . Essential hypertension, benign 09/12/2015  . 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 Preventive measure 02/09/2013  . Elevated blood-pressure reading without diagnosis of hypertension 02/09/2013  . Hypothyroidism 10/15/2012  . Lumbar strain 05/06/2012  . Bipolar disorder ([place])   . Anxiety   . 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 Obesity 03/05/2011  . Thyroid activity decreased 12/30/2010  . Celiac disease/sprue 11/21/2010     OBJECTIVE:   BP 120/90   Pulse 87   Ht 5\' 2"  (1.575 m)   Wt 174 lb 2 oz (79 kg)   LMP 10/28/2006 Comment: early menopause due to Turner syndrome  SpO2 96%   BMI 31.85 kg/m    Physical exam: General: well-Appearing patient, no apparent distress Respiratory: Work of breathing, speaking complete sentences   ASSESSMENT/PLAN:   Hypothyroidism Patient continues to take Synthroid 88 mcg daily.  She is hopeful that she can decrease the amount of Synthroid she is taking, however I explained to her that she will most likely have to continue taking the medication. -Checking TSH  Type 2 diabetes  mellitus without complication, without long-term current use of insulin ([place]) Patient's HbA1c today 5.8%!  This is without any medication, slowly with dietary and lifestyle modification. -Feel patient can continue without insulin glargine, Ozempic, Jardiance, Dexcom. -Strongly encouraged to continue her dietary and lifestyle modifications  Mixed hyperlipidemia Patient with history of hyperlipidemia and hypertriglyceridemia.   She is prescribed Lipitor 80 mg and Zetia 10 mg, she reports she has not taken either of these in over a month.  Also used to be taking over-the-counter fish oil but has not taken this in several weeks. The patient does have Turner syndrome, therefore her elevated lipid levels probably have a strong genetic tie.  Current ASCVD risk is 2.6%, moderate-intensity statin recommended because of known diabetes but 10-year risk less than 7.5%.  "If LDL less than 70 can consider additional factors like lifestyle and risk benefits before starting a statin". -Rechecking lipid panel today to see if patient can decrease the amount of medicine she is taking, or stop taking them altogether.   -We will follow-up with results and guide as to next steps     [place]

## 2020-01-02 NOTE — Assessment & Plan Note (Signed)
Patient with history of hyperlipidemia and hypertriglyceridemia.  She is prescribed Lipitor 80 mg and Zetia 10 mg, she reports she has not taken either of these in over a month.  Also used to be taking over-the-counter fish oil but has not taken this in several weeks. The patient does have Turner syndrome, therefore her elevated lipid levels probably have a strong genetic tie.  Current ASCVD risk is 2.6%, moderate-intensity statin recommended because of known diabetes but 10-year risk less than 7.5%.  "If LDL less than 70 can consider additional factors like lifestyle and risk benefits before starting a statin". -Rechecking lipid panel today to see if patient can decrease the amount of medicine she is taking, or stop taking them altogether.   -We will follow-up with results and guide as to next steps

## 2020-01-03 LAB — LIPID PANEL
Chol/HDL Ratio: 7 ratio — ABNORMAL HIGH (ref 0.0–4.4)
Cholesterol, Total: 237 mg/dL — ABNORMAL HIGH (ref 100–199)
HDL: 34 mg/dL — ABNORMAL LOW (ref >39)
LDL Chol Calc (NIH): 161 mg/dL — ABNORMAL HIGH (ref 0–99)
Triglycerides: 224 mg/dL — ABNORMAL HIGH (ref 0–149)
VLDL Cholesterol Cal: 42 mg/dL — ABNORMAL HIGH (ref 5–40)

## 2020-01-03 LAB — TSH: TSH: 7.02 u[IU]/mL — ABNORMAL HIGH (ref 0.450–4.500)

## 2020-01-17 ENCOUNTER — Ambulatory Visit (INDEPENDENT_AMBULATORY_CARE_PROVIDER_SITE_OTHER): Payer: No Typology Code available for payment source | Admitting: Sports Medicine

## 2020-01-17 ENCOUNTER — Encounter: Payer: Self-pay | Admitting: Sports Medicine

## 2020-01-17 ENCOUNTER — Other Ambulatory Visit: Payer: Self-pay

## 2020-01-17 DIAGNOSIS — Q969 Turner's syndrome, unspecified: Secondary | ICD-10-CM

## 2020-01-17 DIAGNOSIS — M67471 Ganglion, right ankle and foot: Secondary | ICD-10-CM | POA: Diagnosis not present

## 2020-01-17 DIAGNOSIS — E119 Type 2 diabetes mellitus without complications: Secondary | ICD-10-CM

## 2020-01-17 DIAGNOSIS — Z87798 Personal history of other (corrected) congenital malformations: Secondary | ICD-10-CM | POA: Diagnosis not present

## 2020-01-17 NOTE — Progress Notes (Signed)
Subjective: Holly Richards is a 39 y.o. female patient who returns to office for follow-up evaluation of cyst at right second toe cyst reports that he has come back and wants to discuss again about having it removed. Reports that it seems like it is gotten bigger no major pain or aggravation but is concerned about regrowth. Patient denies any changes with medical history and reports that she is recently had left wrist surgery. No other issues noted.  Patient Active Problem List   Diagnosis Date Noted  . Closed fracture of lower end of left radius with malunion 10/28/2019  . Atrophic vaginitis 04/05/2019  . OAG (open angle glaucoma) suspect, low risk, bilateral 02/12/2019  . Mixed hyperlipidemia 09/06/2018  . Subclinical hypothyroidism 09/17/2015  . Liver enzyme elevation 09/17/2015  . Type 2 diabetes mellitus without complication, without long-term current use of insulin ([place]) 09/12/2015  . Essential hypertension, benign 09/12/2015  . 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 Preventive measure 02/09/2013  . Elevated blood-pressure reading without diagnosis of hypertension 02/09/2013  . Hypothyroidism 10/15/2012  . Lumbar strain 05/06/2012  . Bipolar disorder ([place])   . Anxiety   . 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 Obesity 03/05/2011  . Thyroid activity decreased 12/30/2010  . Celiac disease/sprue 11/21/2010    Current Outpatient Medications on File Prior to Visit  Medication Sig Dispense Refill  . atorvastatin (LIPITOR) 80 MG tablet Take 1 tablet (80 mg total) by mouth daily. 90 tablet 3  . Blood Glucose Monitoring Suppl (ONETOUCH VERIO) w/Device KIT 1 kit by Does not apply route daily. 1 kit 0  . Continuous Blood Gluc Receiver (DEXCOM G6 RECEIVER) DEVI 1 Product by Other route as directed. 1 Product 11  . Continuous Blood Gluc Sensor (DEXCOM G6 SENSOR) MISC Inject 1 applicator into the skin as directed. Change sensor every 10 days. 3 each 11  . Continuous Blood Gluc Transmit (DEXCOM G6  TRANSMITTER) MISC Inject 1 Device into the skin as directed. Change transmitter every 90 days (will reuse 9 times) 1 each 3  . dicyclomine (BENTYL) 20 MG tablet Take 20 mg by mouth every 6 (six) hours as needed.  0  . ezetimibe (ZETIA) 10 MG tablet Take 1 tablet (10 mg total) by mouth daily. 90 tablet 3  . Insulin Glargine (BASAGLAR KWIKPEN) 100 UNIT/ML INJECT 0.2 MLS (20 UNITS TOTAL) INTO THE SKIN DAILY. INSURANCE 1BOX/30 DAYS (Patient taking differently: 10 Units. ) 15 pen 3  . Insulin Pen Needle (PEN NEEDLES) 31G X 8 MM MISC Use as directed 100 each 11  . JARDIANCE 10 MG TABS tablet TAKE 1 TABLET BY MOUTH ONCE A DAY 90 tablet 1  . Lancet Device MISC Use with glucometer 1 each 0  . levothyroxine (SYNTHROID) 88 MCG tablet TAKE 1 TABLET (88 MCG TOTAL) BY MOUTH EVERY MORNING. 30 MINUTES BEFORE FOOD 90 tablet 0  . lisinopril (ZESTRIL) 10 MG tablet Take 1 tablet (10 mg total) by mouth daily. 90 tablet 0  . Multiple Vitamins-Minerals (MULTIVITAMIN PO) Take 1 tablet by mouth daily.    Marland Kitchen oxyCODONE-acetaminophen (PERCOCET) 5-325 MG tablet 1-2 tabs PO q6 hours prn pain 30 tablet 0  . OZEMPIC, 1 MG/DOSE, 2 MG/1.5ML SOPN INJECT 1 MG INTO THE SKIN ONCE A WEEK. 3 pen 3   No current facility-administered medications on file prior to visit.    Allergies  Allergen Reactions  . Oseltamivir Phosphate Anaphylaxis   Family History  Problem Relation Age of Onset  . Autoimmune disease Mother   .  Alcohol abuse Father   . Hyperlipidemia Father   . Hypertension Father   . Amenorrhea Brother   . Diabetes Maternal Grandmother   . Diabetes Maternal Grandfather     Social History   Socioeconomic History  . Marital status: Married    Spouse name: Not on file  . Number of children: Not on file  . Years of education: Not on file  . Highest education level: Not on file  Occupational History  . Not on file  Tobacco Use  . Smoking status: Never Smoker  . Smokeless tobacco: Never Used  Substance and Sexual  Activity  . Alcohol use: Yes    Comment: social  . Drug use: Never  . Sexual activity: Yes    Birth control/protection: Post-menopausal    Comment: no periods since age 19, 2008  Other Topics Concern  . Not on file  Social History Narrative  . Not on file   Social Determinants of Health   Financial Resource Strain:   . Difficulty of Paying Living Expenses: Not on file  Food Insecurity:   . Worried About Charity fundraiser in the Last Year: Not on file  . Ran Out of Food in the Last Year: Not on file  Transportation Needs:   . Lack of Transportation (Medical): Not on file  . Lack of Transportation (Non-Medical): Not on file  Physical Activity:   . Days of Exercise per Week: Not on file  . Minutes of Exercise per Session: Not on file  Stress:   . Feeling of Stress : Not on file  Social Connections:   . Frequency of Communication with Friends and Family: Not on file  . Frequency of Social Gatherings with Friends and Family: Not on file  . Attends Religious Services: Not on file  . Active Member of Clubs or Organizations: Not on file  . Attends Archivist Meetings: Not on file  . Marital Status: Not on file   Past Surgical History:  Procedure Laterality Date  . CHOLECYSTECTOMY    . CHOLECYSTECTOMY  2008  . COLONOSCOPY    . CORNEAL TRANSPLANT    . OPEN REDUCTION INTERNAL FIXATION (ORIF) DISTAL RADIAL FRACTURE Left 10/21/2019   Procedure: OPEN REDUCTION INTERNAL FIXATION (ORIF) DISTAL RADIAL FRACTURE WITH BONE GRAFT;  Surgeon: Leanora Cover, MD;  Location: [facility];  Service: Orthopedics;  Laterality: Left;  . WRIST OSTEOTOMY Left 10/21/2019   Procedure: DISTAL RADIUS OSTEOTOMY LEFT;  Surgeon: Leanora Cover, MD;  Location: [facility];  Service: Orthopedics;  Laterality: Left;   Objective:  General: Alert and oriented x3 in no acute distress  Dermatology: Raised soft tissue lesion noted to the dorsal DIPJ of the right second toe  consistent with cyst hemorrhagic in nature.  No open lesions bilateral lower extremities, no webspace macerations, no ecchymosis bilateral, all nails x 10 are well manicured.  Vascular: Dorsalis Pedis and Posterior Tibial pedal pulses palpable, Capillary Fill Time 3 seconds,(+) pedal hair growth bilateral, no edema bilateral lower extremities, Temperature gradient within normal limits.  Neurology: Gross sensation intact via light touch bilateral.   Musculoskeletal: No tenderness or gross deformity noted to the right second toe.  Strength within normal limits in all groups bilateral.  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  Using a sterile 11 blade after anesthetic prep lanced cyst at right second toe and applied antibiotic cream and Coban compression dressing and advised patient to reapply antibiotic cream daily -Patient opt for surgical management. Consent obtained for excision of cyst and fusion of joint with possible K wire at right second toe.  Pre and Post op course explained. Risks, benefits, alternatives explained. No guarantees given or implied. Surgical booking slip submitted and provided patient with Surgical packet and info for [facility] -H&P form given -To dispense postoperative shoes at surgical center and must wear when she returns to work  -Advised patient that we will try to accommodate her to get surgery scheduled on a Friday afternoon -Patient to return to office after surgery or sooner if condition worsens.  Landis Martins, DPM

## 2020-01-24 ENCOUNTER — Encounter: Payer: Self-pay | Admitting: Family Medicine

## 2020-01-26 ENCOUNTER — Telehealth: Payer: Self-pay

## 2020-01-26 NOTE — Telephone Encounter (Signed)
Received surgery paperwork from the [facility] office. Left a message for Holly Richards to call and we can get her surgery scheduled.

## 2020-02-16 ENCOUNTER — Encounter: Payer: Self-pay | Admitting: Family Medicine

## 2020-02-16 ENCOUNTER — Telehealth: Payer: Self-pay | Admitting: Sports Medicine

## 2020-02-16 NOTE — Telephone Encounter (Signed)
DOS: 02-27-2020  Procedures: Exc. Ganglion Toe Rt ([id number]) and Hammertoe Repair 2nd Rt ([id number])  [employer] [place] Preferred Effective From: 12/15/2018 -  Deductible: $2,800 with $2,800 met and $0 remaining. Out of Network: $5,600 with $5,600 met and $0 remaining. CoInsurance: 100% Copay: $0  Per Opal Sidles L no prior authorization or referrals are required.  Call reference #[id number]

## 2020-02-20 ENCOUNTER — Other Ambulatory Visit: Payer: Self-pay

## 2020-02-20 ENCOUNTER — Encounter (HOSPITAL_BASED_OUTPATIENT_CLINIC_OR_DEPARTMENT_OTHER): Payer: Self-pay | Admitting: Sports Medicine

## 2020-02-20 NOTE — Progress Notes (Addendum)
Spoke w/ via phone for pre-op interview---pt Lab needs dos---I stat 8-               COVID test ------02-24-2020 1435 Arrive at -------1200 pm 02-27-2020 NPO after MN NO Solid Food.  Clear liquids from MN until---1100 am then npo Medications to take morning of surgery ----none- Diabetic medication -----diet controlled dm Patient Special Instructions -----none Pre-Op special Istructions -----none Patient verbalized understanding of instructions that were given at this phone interview. Patient denies shortness of breath, chest pain, fever, cough at this phone interview.   Anesthesia Review: type 2 diet controlled dm, off blood pressure meds and thryoid medications since 01-02-2020 due to 55 pound weight loss, lov dr Rise Patience turner cardiology [phone] epic, had cardiac clearance dr Claiborne Billings 10-13-2019 for left orif 10-21-2019 surgery, turner syndrome,  echo results 05-09-2019 recommend considering follow up tee and chest cta.pt has orders for anesthesia consult prior to surgery.  Chart to Janett Billow zanetto pa for review  PCP: dr caitlyn Page [facility]t 02-21-2020 for surgical,  Clearance, H & P/surgical clearcne dr Noni Saupe dated 02-21-2020 on chart for 02-27-2020 surgery Cardiologist : dr Tonita Cong 04-07-2019 epicdr laura turner  Chest x-ray :none EKG :04-07-2019 Echo : 05-09-2019 Stress test:none Cardiac Cath : none Activity level: does own housework and errands, can climb steps without problems Sleep Study/ CPAP :n/a Fasting Blood Sugar :      / Checks Blood Sugar -- times a day:  n/a Blood Thinner/ Instructions /Last Dose:n/a ASA / Instructions/ Last Dose : n/a

## 2020-02-21 ENCOUNTER — Encounter: Payer: Self-pay | Admitting: Family Medicine

## 2020-02-21 ENCOUNTER — Ambulatory Visit (INDEPENDENT_AMBULATORY_CARE_PROVIDER_SITE_OTHER): Payer: No Typology Code available for payment source | Admitting: Family Medicine

## 2020-02-21 ENCOUNTER — Other Ambulatory Visit: Payer: Self-pay

## 2020-02-21 VITALS — BP 136/80 | HR 78 | Ht 62.0 in | Wt 177.0 lb

## 2020-02-21 DIAGNOSIS — Q969 Turner's syndrome, unspecified: Secondary | ICD-10-CM | POA: Diagnosis not present

## 2020-02-21 DIAGNOSIS — E119 Type 2 diabetes mellitus without complications: Secondary | ICD-10-CM | POA: Diagnosis not present

## 2020-02-21 NOTE — Progress Notes (Signed)
Subjective:   Chief Complaint  Patient presents with  . Surgerical Clearance    Holly Richards  is here for a Pre-operative physical at the request of T. Stover, DPM.   She  is having removal of ganglion cyst of right second toe.  Personal or family hx of adverse outcome to anesthesia? No  Chipped, cracked, missing, or loose teeth? missing tooth in back, pulled by dentist, no loose teeth Decreased ROM of neck? No  Able to walk up 2 flights of stairs without becoming significantly short of breath or having chest pain? Yes   Revised Goldman Criteria: High Risk Surgery (intraperitoneal, intrathoracic, aortic): No  Ischemic heart disease (Prior MI, +excercise stress test, angina, nitrate use, Qwave): No  History of heart failure: No  History of cerebrovascular disease: No  History of diabetes: Yes , well controlled, last A1c 5.8% on diet and exercise alone Insulin therapy for DM: No  Preoperative Cr >2.0: No   Revised Goldman Criteria - risk for major cardiac death No risk factors -- 0.4 percent One risk factor -- 1.0 percent  Two risk factors -- 2.4 percent  Three or more risk factors -- 5.4 percent   Patient Active Problem List   Diagnosis Date Noted  . Closed fracture of lower end of left radius with malunion 10/28/2019  . Atrophic vaginitis 04/05/2019  . OAG (open angle glaucoma) suspect, low risk, bilateral 02/12/2019  . Mixed hyperlipidemia 09/06/2018  . Subclinical hypothyroidism 09/17/2015  . Liver enzyme elevation 09/17/2015  . Type 2 diabetes mellitus without complication, without long-term current use of insulin ([place]) 09/12/2015  . Essential hypertension, benign 09/12/2015  . 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 Preventive measure 02/09/2013  . Elevated blood-pressure reading without diagnosis of hypertension 02/09/2013  . Hypothyroidism 10/15/2012  . Lumbar strain 05/06/2012  . Bipolar disorder ([place])   . Anxiety   . 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 Obesity 03/05/2011  .  Thyroid activity decreased 12/30/2010  . Celiac disease/sprue 11/21/2010   Past Medical History:  Diagnosis Date  . Bipolar disorder ([place])    pt denies  . Closed fracture of left distal radius   . Diabetes mellitus without complication ([place])    pcp note says type 2 dm-no meds pt states she is prediabetic  . Ganglion cyst    right foot  . History of hypertension last meds taken 01-02-2020  . History of migraine none recently  . Hypothyroidism    no meds since 01-02-2020  . Thyroid disease    no meds since 01-02-2020  . Turner syndrome     Past Surgical History:  Procedure Laterality Date  . CHOLECYSTECTOMY    . CHOLECYSTECTOMY  2008  . CORNEAL TRANSPLANT Left 2009  . nasal sinus surgery  2008  . OPEN REDUCTION INTERNAL FIXATION (ORIF) DISTAL RADIAL FRACTURE Left 10/21/2019   Procedure: OPEN REDUCTION INTERNAL FIXATION (ORIF) DISTAL RADIAL FRACTURE WITH BONE GRAFT;  Surgeon: Leanora Cover, MD;  Location: [facility];  Service: Orthopedics;  Laterality: Left;  . WRIST OSTEOTOMY Left 10/21/2019   Procedure: DISTAL RADIUS OSTEOTOMY LEFT;  Surgeon: Leanora Cover, MD;  Location: [facility];  Service: Orthopedics;  Laterality: Left;    Current Outpatient Medications  Medication Sig Dispense Refill  . Multiple Vitamins-Minerals (MULTIVITAMIN PO) Take 1 tablet by mouth daily.     No current facility-administered medications for this visit.    Allergies  Allergen Reactions  . Oseltamivir Phosphate Anaphylaxis    tamiflu  Social History   Socioeconomic History  . Marital status: Married    Spouse name: Not on file  . Number of children: Not on file  . Years of education: Not on file  . Highest education level: Not on file  Occupational History  . Not on file  Tobacco Use  . Smoking status: Never Smoker  . Smokeless tobacco: Never Used  Vaping Use  . Vaping Use: Never used  Substance and Sexual Activity  . Alcohol use: Yes    Comment: social  .  Drug use: Never  . Sexual activity: Yes    Birth control/protection: Post-menopausal    Comment: no periods since age 48, 2008  Other Topics Concern  . Not on file  Social History Narrative  . Not on file   Social Determinants of Health   Financial Resource Strain:   . Difficulty of Paying Living Expenses: Not on file  Food Insecurity:   . Worried About Charity fundraiser in the Last Year: Not on file  . Ran Out of Food in the Last Year: Not on file  Transportation Needs:   . Lack of Transportation (Medical): Not on file  . Lack of Transportation (Non-Medical): Not on file  Physical Activity:   . Days of Exercise per Week: Not on file  . Minutes of Exercise per Session: Not on file  Stress:   . Feeling of Stress : Not on file  Social Connections:   . Frequency of Communication with Friends and Family: Not on file  . Frequency of Social Gatherings with Friends and Family: Not on file  . Attends Religious Services: Not on file  . Active Member of Clubs or Organizations: Not on file  . Attends Archivist Meetings: Not on file  . Marital Status: Not on file  Intimate Partner Violence:   . Fear of Current or Ex-Partner: Not on file  . Emotionally Abused: Not on file  . Physically Abused: Not on file  . Sexually Abused: Not on file    Family History  Problem Relation Age of Onset  . Autoimmune disease Mother   . Alcohol abuse Father   . Hyperlipidemia Father   . Hypertension Father   . Amenorrhea Brother   . Diabetes Maternal Grandmother   . Diabetes Maternal Grandfather      Review of Systems:  Constitutional:  no unexpected change in weight, no weakness, no unexplained fevers, sweats, or chills Eye:  no recent significant change in vision Ear:  no hearing loss Nose/Mouth/Throat:  No dental complaints Neck/Thyroid:  no lumps or masses Pulmonary:  no chronic cough, sputum, or hemoptysis and no shortness of breath Cardiovascular:  no exercise intolerance,  no chest pain Gastrointestinal:  no abdominal pain and no change in bowel habits GU:  negative for dysuria, frequency, and incontinence Musculoskeletal/Extremities:  no peripheral edema Skin/Integumentary ROS:  no abnormal skin lesions reported Neurologic:  no numbness, tingling, or tremor   Objective:   Vitals:   02/21/20 1056  BP: 136/80  Pulse: 78  SpO2: 98%  Weight: 177 lb (80.3 kg)  Height: 5\' 2"  (1.575 m)   Body mass index is 32.37 kg/m.  General:  well developed, well nourished, in no apparent distress Skin:  warm, no pallor or diaphoresis Head:  normocephalic, atraumatic Eyes:  pupils equal and round, sclera anicteric without injection Throat/Pharynx:  lips and gingiva without lesion; tongue and uvula midline; non-inflamed pharynx; no exudates or postnasal drainage Neck: neck supple without adenopathy,  thyromegaly, or masses, no bruits, no jugular venous distention Lungs:  clear to auscultation, breath sounds equal bilaterally, no respiratory distress Cardio:  regular rate and rhythm without murmurs Abdomen:  abdomen soft, nontender; bowel sounds normal; no masses, hepatomegaly or splenomegaly Musculoskeletal:  symmetrical muscle groups noted without atrophy or deformity Extremities:  no clubbing, cyanosis, or edema, no deformities, no skin discoloration Neuro:  gait normal; deep tendon reflexes normal and symmetric and alert and oriented to person, place, and time Psych: Age appropriate judgment and insight; normal mood  Assessment:   Turner's syndrome - Plan: DG Bone Density  Type 2 diabetes mellitus without complication, without long-term current use of insulin ([place]) - Plan: Microalbumin/Creatinine Ratio, Urine   Plan:   Orders as above. The above laboratory work was ordered and will be sent with this physical. Patient has A1c at 5.4% in October, off all diabetic medications. Needs DEXA due to Turner's syndrome with fracture in the last year. The patient is  deemed low cardiac risk for the proposed procedure. Optimized for low risk surgery, can proceed with cyst removal next week.  The patient voiced understanding and agreement to the plan. I will send report to T. Milda Smart, MD 02/21/20  11:03 AM

## 2020-02-21 NOTE — Patient Instructions (Addendum)
It was a pleasure to see you today!  1. We are doing routine testing for protein in urine today, part of regular diabetes care. I will let you know results by next week, but with the good work you've been doing, I expect results will be normal.  2. Good luck with surgery next week! I will send the report to your podiatrist.  3. I recommend you have a DEXA scan every 3-5 years since you have Turner's syndrome and have had a fracture this year. Please schedule at your earliest convenience.  4. Keep up the good work!  Be Well,  Dr. Chauncey Reading

## 2020-02-22 LAB — MICROALBUMIN / CREATININE URINE RATIO
Creatinine, Urine: 23.1 mg/dL
Microalb/Creat Ratio: 29 mg/g creat (ref 0–29)
Microalbumin, Urine: 6.7 ug/mL

## 2020-02-23 NOTE — Progress Notes (Signed)
ADDENDUM  To previous progress note by Zelphia Cairo RN on 02-20-2020:  Chart reviewed by anesthesia, Shirley Muscat PA,  Stated pt has pcp clearance and cardiology clearance letter by Dr Berniece Salines dated 05-26-2019 both on chart w/ pt's H&P.

## 2020-02-24 ENCOUNTER — Other Ambulatory Visit (HOSPITAL_COMMUNITY)
Admission: RE | Admit: 2020-02-24 | Discharge: 2020-02-24 | Disposition: A | Discharge status: Home / Self Care | Payer: No Typology Code available for payment source | Source: Ambulatory Visit | Attending: Sports Medicine | Admitting: Sports Medicine

## 2020-02-24 DIAGNOSIS — Z20822 Contact with and (suspected) exposure to covid-19: Secondary | ICD-10-CM | POA: Diagnosis not present

## 2020-02-24 DIAGNOSIS — Z01812 Encounter for preprocedural laboratory examination: Secondary | ICD-10-CM | POA: Diagnosis present

## 2020-02-24 LAB — SARS CORONAVIRUS 2 (TAT 6-24 HRS): SARS Coronavirus 2: NEGATIVE

## 2020-02-27 ENCOUNTER — Encounter (HOSPITAL_BASED_OUTPATIENT_CLINIC_OR_DEPARTMENT_OTHER)
Admission: RE | Disposition: A | Discharge status: Home / Self Care | Payer: Self-pay | Source: Home / Self Care | Attending: Sports Medicine

## 2020-02-27 ENCOUNTER — Ambulatory Visit (HOSPITAL_BASED_OUTPATIENT_CLINIC_OR_DEPARTMENT_OTHER)
Admission: RE | Admit: 2020-02-27 | Discharge: 2020-02-27 | Disposition: A | Discharge status: Home / Self Care | Payer: No Typology Code available for payment source | Attending: Sports Medicine | Admitting: Sports Medicine

## 2020-02-27 ENCOUNTER — Encounter (HOSPITAL_BASED_OUTPATIENT_CLINIC_OR_DEPARTMENT_OTHER): Payer: Self-pay | Admitting: Sports Medicine

## 2020-02-27 ENCOUNTER — Other Ambulatory Visit: Payer: Self-pay

## 2020-02-27 ENCOUNTER — Ambulatory Visit (HOSPITAL_BASED_OUTPATIENT_CLINIC_OR_DEPARTMENT_OTHER): Payer: No Typology Code available for payment source | Admitting: Physician Assistant

## 2020-02-27 DIAGNOSIS — Z947 Corneal transplant status: Secondary | ICD-10-CM | POA: Insufficient documentation

## 2020-02-27 DIAGNOSIS — Z888 Allergy status to other drugs, medicaments and biological substances status: Secondary | ICD-10-CM | POA: Insufficient documentation

## 2020-02-27 DIAGNOSIS — M67471 Ganglion, right ankle and foot: Secondary | ICD-10-CM | POA: Insufficient documentation

## 2020-02-27 DIAGNOSIS — Z9049 Acquired absence of other specified parts of digestive tract: Secondary | ICD-10-CM | POA: Diagnosis not present

## 2020-02-27 DIAGNOSIS — M2041 Other hammer toe(s) (acquired), right foot: Secondary | ICD-10-CM | POA: Diagnosis not present

## 2020-02-27 HISTORY — PX: GANGLION CYST EXCISION: SHX1691

## 2020-02-27 HISTORY — PX: HAMMER TOE SURGERY: SHX385

## 2020-02-27 HISTORY — DX: Personal history of other diseases of the nervous system and sense organs: Z86.69

## 2020-02-27 HISTORY — DX: Ganglion, unspecified site: M67.40

## 2020-02-27 HISTORY — DX: Personal history of other diseases of the circulatory system: Z86.79

## 2020-02-27 LAB — POCT I-STAT, CHEM 8
BUN: 9 mg/dL (ref 6–20)
Calcium, Ion: 1.3 mmol/L (ref 1.15–1.40)
Chloride: 103 mmol/L (ref 98–111)
Creatinine, Ser: 0.4 mg/dL — ABNORMAL LOW (ref 0.44–1.00)
Glucose, Bld: 117 mg/dL — ABNORMAL HIGH (ref 70–99)
HCT: 45 % (ref 36.0–46.0)
Hemoglobin: 15.3 g/dL — ABNORMAL HIGH (ref 12.0–15.0)
Potassium: 4.3 mmol/L (ref 3.5–5.1)
Sodium: 142 mmol/L (ref 135–145)
TCO2: 24 mmol/L (ref 22–32)

## 2020-02-27 SURGERY — EXCISION, GANGLION CYST, FOOT
Anesthesia: Monitor Anesthesia Care | Site: Toe | Laterality: Right

## 2020-02-27 MED ORDER — PROPOFOL 10 MG/ML IV BOLUS
INTRAVENOUS | Status: AC
  Filled 2020-02-27: qty 20

## 2020-02-27 MED ORDER — LIDOCAINE HCL (PF) 2 % IJ SOLN
INTRAMUSCULAR | Status: AC
  Filled 2020-02-27: qty 5

## 2020-02-27 MED ORDER — CHLORHEXIDINE GLUCONATE CLOTH 2 % EX PADS: 6.0000 | MEDICATED_PAD | Freq: Once | CUTANEOUS | Status: DC

## 2020-02-27 MED ORDER — LIDOCAINE HCL (CARDIAC) PF 100 MG/5ML IV SOSY
PREFILLED_SYRINGE | INTRAVENOUS | Status: DC | PRN
  Administered 2020-02-27: 40 mg via INTRAVENOUS

## 2020-02-27 MED ORDER — PROPOFOL 10 MG/ML IV BOLUS
INTRAVENOUS | Status: DC | PRN
  Administered 2020-02-27: 20 mg via INTRAVENOUS

## 2020-02-27 MED ORDER — DOCUSATE SODIUM 100 MG PO CAPS: 100.0000 mg | ORAL_CAPSULE | Freq: Every day | ORAL | 2 refills | Status: AC | PRN

## 2020-02-27 MED ORDER — MIDAZOLAM HCL 2 MG/2ML IJ SOLN
INTRAMUSCULAR | Status: AC
  Filled 2020-02-27: qty 2

## 2020-02-27 MED ORDER — PROMETHAZINE HCL 12.5 MG PO TABS: 12.5000 mg | ORAL_TABLET | Freq: Four times a day (QID) | ORAL | 0 refills | Status: DC | PRN

## 2020-02-27 MED ORDER — OXYCODONE HCL 5 MG PO TABS: 5.0000 mg | ORAL_TABLET | Freq: Once | ORAL | Status: DC | PRN

## 2020-02-27 MED ORDER — FENTANYL CITRATE (PF) 100 MCG/2ML IJ SOLN: 25.0000 ug | INTRAMUSCULAR | Status: DC | PRN

## 2020-02-27 MED ORDER — PROPOFOL 500 MG/50ML IV EMUL
INTRAVENOUS | Status: DC | PRN
  Administered 2020-02-27: 50 ug/kg/min via INTRAVENOUS

## 2020-02-27 MED ORDER — ONDANSETRON HCL 4 MG/2ML IJ SOLN
INTRAMUSCULAR | Status: AC
  Filled 2020-02-27: qty 2

## 2020-02-27 MED ORDER — KETOROLAC TROMETHAMINE 30 MG/ML IJ SOLN
INTRAMUSCULAR | Status: AC
  Filled 2020-02-27: qty 1

## 2020-02-27 MED ORDER — ONDANSETRON HCL 4 MG/2ML IJ SOLN
INTRAMUSCULAR | Status: DC | PRN
  Administered 2020-02-27: 4 mg via INTRAVENOUS

## 2020-02-27 MED ORDER — OXYCODONE HCL 5 MG/5ML PO SOLN: 5.0000 mg | Freq: Once | ORAL | Status: DC | PRN

## 2020-02-27 MED ORDER — CEFAZOLIN SODIUM-DEXTROSE 2-4 GM/100ML-% IV SOLN
INTRAVENOUS | Status: AC
  Filled 2020-02-27: qty 100

## 2020-02-27 MED ORDER — IBUPROFEN 800 MG PO TABS: 800.0000 mg | ORAL_TABLET | Freq: Three times a day (TID) | ORAL | 0 refills | Status: DC | PRN

## 2020-02-27 MED ORDER — CEFAZOLIN SODIUM-DEXTROSE 2-4 GM/100ML-% IV SOLN
2.0000 g | INTRAVENOUS | Status: AC
  Administered 2020-02-27: 14:00:00 2 g via INTRAVENOUS

## 2020-02-27 MED ORDER — FENTANYL CITRATE (PF) 100 MCG/2ML IJ SOLN
INTRAMUSCULAR | Status: DC | PRN
  Administered 2020-02-27 (×4): 25 ug via INTRAVENOUS

## 2020-02-27 MED ORDER — KETOROLAC TROMETHAMINE 30 MG/ML IJ SOLN
INTRAMUSCULAR | Status: DC | PRN
  Administered 2020-02-27: 30 mg via INTRAVENOUS

## 2020-02-27 MED ORDER — HYDROCODONE-ACETAMINOPHEN 5-325 MG PO TABS
1.0000 | ORAL_TABLET | Freq: Four times a day (QID) | ORAL | 0 refills | Status: AC | PRN
Start: 2020-02-27 — End: 2020-03-05

## 2020-02-27 MED ORDER — LACTATED RINGERS IV SOLN
INTRAVENOUS | Status: DC
  Administered 2020-02-27: 12:00:00 1000 mL via INTRAVENOUS

## 2020-02-27 MED ORDER — DEXAMETHASONE SODIUM PHOSPHATE 4 MG/ML IJ SOLN
INTRAMUSCULAR | Status: DC | PRN
  Administered 2020-02-27: 5 mg via INTRAVENOUS

## 2020-02-27 MED ORDER — LIDOCAINE HCL 1 % IJ SOLN
INTRAMUSCULAR | Status: DC | PRN
  Administered 2020-02-27: 20 mL via INTRAMUSCULAR

## 2020-02-27 MED ORDER — MIDAZOLAM HCL 5 MG/5ML IJ SOLN
INTRAMUSCULAR | Status: DC | PRN
  Administered 2020-02-27: 2 mg via INTRAVENOUS

## 2020-02-27 MED ORDER — PROPOFOL 500 MG/50ML IV EMUL
INTRAVENOUS | Status: AC
  Filled 2020-02-27: qty 50

## 2020-02-27 MED ORDER — FENTANYL CITRATE (PF) 100 MCG/2ML IJ SOLN
INTRAMUSCULAR | Status: AC
  Filled 2020-02-27: qty 2

## 2020-02-27 SURGICAL SUPPLY — 52 items
BLADE OSC/SAG .038X5.5 CUT EDG (BLADE) ×4 IMPLANT
BLADE SURG 15 STRL LF DISP TIS (BLADE) ×4 IMPLANT
BLADE SURG 15 STRL SS (BLADE) ×8
BNDG CMPR 9X4 STRL LF SNTH (GAUZE/BANDAGES/DRESSINGS) ×2
BNDG COHESIVE 1X5 TAN NS LF (GAUZE/BANDAGES/DRESSINGS) ×4 IMPLANT
BNDG COHESIVE 3X5 TAN STRL LF (GAUZE/BANDAGES/DRESSINGS) ×4 IMPLANT
BNDG CONFORM 2 STRL LF (GAUZE/BANDAGES/DRESSINGS) ×6 IMPLANT
BNDG ELASTIC 3X5.8 VLCR STR LF (GAUZE/BANDAGES/DRESSINGS) ×4 IMPLANT
BNDG ESMARK 4X9 LF (GAUZE/BANDAGES/DRESSINGS) ×4 IMPLANT
BNDG GAUZE ELAST 4 BULKY (GAUZE/BANDAGES/DRESSINGS) ×4 IMPLANT
COVER BACK TABLE 60X90IN (DRAPES) ×4 IMPLANT
COVER WAND RF STERILE (DRAPES) IMPLANT
CUFF TOURN SGL QUICK 18X4 (TOURNIQUET CUFF) ×4 IMPLANT
DRAPE EXTREMITY T 121X128X90 (DISPOSABLE) ×4 IMPLANT
DRAPE IMP U-DRAPE 54X76 (DRAPES) ×4 IMPLANT
DRAPE OEC MINIVIEW 54X84 (DRAPES) ×4 IMPLANT
DRAPE SURG 17X23 STRL (DRAPES) ×4 IMPLANT
DRSG PAD ABDOMINAL 8X10 ST (GAUZE/BANDAGES/DRESSINGS) IMPLANT
DRSG TEGADERM 2-3/8X2-3/4 SM (GAUZE/BANDAGES/DRESSINGS) ×2 IMPLANT
DURAPREP 26ML APPLICATOR (WOUND CARE) ×4 IMPLANT
ELECT REM PT RETURN 9FT ADLT (ELECTROSURGICAL) ×4
ELECTRODE REM PT RTRN 9FT ADLT (ELECTROSURGICAL) ×2 IMPLANT
GAUZE 4X4 16PLY RFD (DISPOSABLE) IMPLANT
GAUZE SPONGE 4X4 12PLY STRL (GAUZE/BANDAGES/DRESSINGS) ×4 IMPLANT
GAUZE XEROFORM 1X8 LF (GAUZE/BANDAGES/DRESSINGS) ×4 IMPLANT
GLOVE BIO SURGEON STRL SZ 6.5 (GLOVE) ×3 IMPLANT
GLOVE BIO SURGEONS STRL SZ 6.5 (GLOVE) ×1
GLOVE SURG UNDER POLY LF SZ6.5 (GLOVE) ×4 IMPLANT
GOWN STRL REUS W/ TWL LRG LVL3 (GOWN DISPOSABLE) ×4 IMPLANT
GOWN STRL REUS W/TWL LRG LVL3 (GOWN DISPOSABLE) ×8
KIT TURNOVER CYSTO (KITS) ×4 IMPLANT
NDL HYPO 25X1 1.5 SAFETY (NEEDLE) ×4 IMPLANT
NDL SAFETY ECLIPSE 18X1.5 (NEEDLE) ×2 IMPLANT
NEEDLE HYPO 18GX1.5 SHARP (NEEDLE) ×4
NEEDLE HYPO 25X1 1.5 SAFETY (NEEDLE) ×8 IMPLANT
NS IRRIG 1000ML POUR BTL (IV SOLUTION) IMPLANT
PACK BASIN DAY SURGERY FS (CUSTOM PROCEDURE TRAY) ×4 IMPLANT
PADDING CAST ABS 4INX4YD NS (CAST SUPPLIES) ×2
PADDING CAST ABS COTTON 4X4 ST (CAST SUPPLIES) ×2 IMPLANT
PENCIL SMOKE EVACUATOR (MISCELLANEOUS) ×4 IMPLANT
SLEEVE SCD COMPRESS KNEE MED (MISCELLANEOUS) ×4 IMPLANT
SPONGE GAUZE 2X2 8PLY STER LF (GAUZE/BANDAGES/DRESSINGS) ×1
SPONGE GAUZE 2X2 8PLY STRL LF (GAUZE/BANDAGES/DRESSINGS) ×1 IMPLANT
STOCKINETTE 6  STRL (DRAPES) ×4
STOCKINETTE 6 STRL (DRAPES) ×2 IMPLANT
SUT ETHILON 3 0 PS 1 (SUTURE) ×4 IMPLANT
SUT ETHILON 4 0 PS 2 18 (SUTURE) IMPLANT
SUT VIC AB 3-0 FS2 27 (SUTURE) IMPLANT
SYR 10ML LL (SYRINGE) ×8 IMPLANT
SYR BULB EAR ULCER 3OZ GRN STR (SYRINGE) ×4 IMPLANT
TOWEL OR 17X26 10 PK STRL BLUE (TOWEL DISPOSABLE) ×4 IMPLANT
UNDERPAD 30X36 HEAVY ABSORB (UNDERPADS AND DIAPERS) ×4 IMPLANT

## 2020-02-27 NOTE — Anesthesia Preprocedure Evaluation (Signed)
Anesthesia Evaluation  Patient identified by MRN, date of birth, ID band Patient awake    Reviewed: Allergy & Precautions, NPO status , Patient's Chart, lab work & pertinent test results  Airway Mallampati: II  TM Distance: >3 FB Neck ROM: Full    Dental no notable dental hx.    Pulmonary neg pulmonary ROS,    Pulmonary exam normal        Cardiovascular Exercise Tolerance: Good hypertension, Pt. on medications Normal cardiovascular exam     Neuro/Psych PSYCHIATRIC DISORDERS Anxiety Bipolar Disorder    GI/Hepatic   Endo/Other  diabetes, Type 2, Insulin DependentHypothyroidism Obesity Turner syndrome  Renal/GU   negative genitourinary   Musculoskeletal negative musculoskeletal ROS (+)   Abdominal   Peds negative pediatric ROS (+)  Hematology negative hematology ROS (+)   Anesthesia Other Findings   Reproductive/Obstetrics negative OB ROS                             Anesthesia Physical  Anesthesia Plan  ASA: II  Anesthesia Plan: MAC   Post-op Pain Management:    Induction: Intravenous  PONV Risk Score and Plan: 2 and Treatment may vary due to age or medical condition and Ondansetron  Airway Management Planned: Natural Airway and Simple Face Mask  Additional Equipment:   Intra-op Plan:   Post-operative Plan:   Informed Consent: I have reviewed the patients History and Physical, chart, labs and discussed the procedure including the risks, benefits and alternatives for the proposed anesthesia with the patient or authorized representative who has indicated his/her understanding and acceptance.       Plan Discussed with: CRNA and Surgeon  Anesthesia Plan Comments: (MAC with local by surgeon. Propofol gtt. GA/LMA as backup plan. Norton Blizzard, MD  )        Anesthesia Quick Evaluation

## 2020-02-27 NOTE — Op Note (Signed)
DATE OF OPERATION: 02/27/20  PREOPERATIVE DIAGNOSES:  1. Soft tissue mass, right 2nd toe 2. Hammertoe, right 2nd toe   POSTOPERATIVE DIAGNOSIS:  Same  OPERATION PERFORMED:  1.  Excision of cyst, right 2nd toe 2.  Hammertoe repair (arthroplasty), right 2nd toe   SURGEON: Landis Martins, DPM   ASSISTANT: None  ESTIMATED BLOOD LOSS:  5cc  HEMOSTASIS:  Ankle tourniquet   INJECTABLES:  Pre-and post Twenty mL of 1% lidocaine plain and 0.5% Marcaine plain.   INDICATIONS FOR OPERATION:  The patient is a 39 y.o. female with clinical and radiographic signs and symptoms consistent with the above-stated diagnosis.The patient has chosen surgical correction of the diagnosis and has consented for surgery. All risks, benefits and complications have been explained to the patient including but not limited to recurrence of the deformity, dehiscence of incision site and the need for further surgery. The patient understands. No guarantees were made or implied to the outcome of the surgery.   DESCRIPTION OF OPERATION:  The patient was brought into the operating room and positioned in OR table in the supine position. Following appropriate padding of all bony areas, local anesthesia was administered using injectable as above and tourniquet was placed at right ankle but not yet inflated. Following the foot was then prepped and draped.   Attention was directed towards the right 2nd toe where anesthesia was confirmed and then using a 15 blade a dorsal longitudinal incision was made centered over the interphalangeal joint of each corresponding in a transverse fashion to excised out the 0.5cm raised soft tissue mass of the IPJ,   The incision was deepened through skin and subcutaneous tissue with care to protect and retract all vital neurovascular structures.  Once down to the level of the joint capsule and tendon a transverse tenotomy was made freeing up the joint at the interphalangeal joint and then exposing  the head of the proximal phalanx in a stepwise fashion then utilizing a sagittal saw the head was then resected and passed off to the back table.  Following the area was then copiously flushed.  The subcutaneous tissue over the incision areas were coapted using 3-0 Vicryl.  The skin was re-coapted utilizing 3-0 nylon in a simple interrupted suture fashion. A dry sterile dressing was applied and the tourniquet was rapidly deflated and prompt instantaneous hyperemic response was noted to all aspects of the patient's right lower extremity with digital capillary refill time measuring less than 3 seconds to digits 1-5 of the right foot.   The patient tolerated the procedure and local anesthesia well and was transported from the operating room to the recovery with vital signs stable and vascular status intact to all aspects of the patient's Right lower extremity.  Following a period of postoperative monitoring, the patient was discharged home with oral and written instructions per Dr. Cannon Kettle. The patient was given postop prescriptions. The patient will remain weightbearing with surgical shoe and to follow up in office in 1 week for continued post op care.  Landis Martins, DPM

## 2020-02-27 NOTE — Transfer of Care (Signed)
Immediate Anesthesia Transfer of Care Note  Patient: Holly Richards  Procedure(s) Performed: Procedure(s) (LRB): REMOVAL GANGLION CYST RIGHT FOOT (Right) HAMMER TOE REPAIR SECOND TOE OF RIGHT FOOT (Right)  Patient Location: PACU  Anesthesia Type: MAC  Level of Consciousness: awake, sedated, patient cooperative and responds to stimulation  Airway & Oxygen Therapy: Patient Spontanous Breathing on RA - awake   Post-op Assessment: Report given to PACU RN, Post -op Vital signs reviewed and stable and Patient moving all extremities  Post vital signs: Reviewed and stable  Complications: No apparent anesthesia complications

## 2020-02-27 NOTE — Anesthesia Procedure Notes (Signed)
Procedure Name: MAC Date/Time: 02/27/2020 1:55 PM Performed by: Justice Rocher, CRNA Pre-anesthesia Checklist: Timeout performed, Patient being monitored, Suction available, Emergency Drugs available and Patient identified Patient Re-evaluated:Patient Re-evaluated prior to induction Oxygen Delivery Method: Simple face mask Preoxygenation: Pre-oxygenation with 100% oxygen Induction Type: IV induction Placement Confirmation: positive ETCO2,  CO2 detector and breath sounds checked- equal and bilateral

## 2020-02-27 NOTE — H&P (Signed)
H&P: Podiatry   TFT:DDUKG Holly Richards 39 y.o. female  patient with a history of right toe pain seen on several occassions in office complaining of pain at mass at toe; Patient has tried multiple conservative treatments and opted for Surgical management. Patient had pre-op physical and clearance for excision of cyst right 2nd toe completed. Patient met this PM in pre-op holding area; informed consent reviewed and signed. All questions answered. Right foot/surgical site marked.  This am patient denies any other pedal complaints. Denies Nausea/fever/vomiting/chills/night sweats/overnight events. Confirms NPO since midnight.   Patient Active Problem List   Diagnosis Date Noted  . Closed fracture of lower end of left radius with malunion 10/28/2019  . Atrophic vaginitis 04/05/2019  . OAG (open angle glaucoma) suspect, low risk, bilateral 02/12/2019  . Mixed hyperlipidemia 09/06/2018  . Subclinical hypothyroidism 09/17/2015  . Liver enzyme elevation 09/17/2015  . Type 2 diabetes mellitus without complication, without long-term current use of insulin ([place]) 09/12/2015  . Essential hypertension, benign 09/12/2015  . 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 Preventive measure 02/09/2013  . Elevated blood-pressure reading without diagnosis of hypertension 02/09/2013  . Hypothyroidism 10/15/2012  . Lumbar strain 05/06/2012  . Mood disorder ([place])   . Anxiety   . 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 Obesity 03/05/2011  . Thyroid activity decreased 12/30/2010  . Celiac disease/sprue 11/21/2010    No current facility-administered medications on file prior to encounter.   Current Outpatient Medications on File Prior to Encounter  Medication Sig Dispense Refill  . Multiple Vitamins-Minerals (MULTIVITAMIN PO) Take 1 tablet by mouth daily.       Allergies  Allergen Reactions  . Oseltamivir Phosphate Anaphylaxis    tamiflu    Social History   Socioeconomic History  . Marital status: Married     Spouse name: Not on file  . Number of children: Not on file  . Years of education: Not on file  . Highest education level: Not on file  Occupational History  . Not on file  Tobacco Use  . Smoking status: Never Smoker  . Smokeless tobacco: Never Used  Vaping Use  . Vaping Use: Never used  Substance and Sexual Activity  . Alcohol use: Yes    Comment: social  . Drug use: Never  . Sexual activity: Yes    Birth control/protection: Post-menopausal    Comment: no periods since age 64, 2008  Other Topics Concern  . Not on file  Social History Narrative  . Not on file   Social Determinants of Health   Financial Resource Strain: Not on file  Food Insecurity: Not on file  Transportation Needs: Not on file  Physical Activity: Not on file  Stress: Not on file  Social Connections: Not on file  Intimate Partner Violence: Not on file    Past Surgical History:  Procedure Laterality Date  . CHOLECYSTECTOMY    . CHOLECYSTECTOMY  2008  . CORNEAL TRANSPLANT Left 2009  . nasal sinus surgery  2008  . OPEN REDUCTION INTERNAL FIXATION (ORIF) DISTAL RADIAL FRACTURE Left 10/21/2019   Procedure: OPEN REDUCTION INTERNAL FIXATION (ORIF) DISTAL RADIAL FRACTURE WITH BONE GRAFT;  Surgeon: Leanora Cover, MD;  Location: [facility];  Service: Orthopedics;  Laterality: Left;  . WRIST OSTEOTOMY Left 10/21/2019   Procedure: DISTAL RADIUS OSTEOTOMY LEFT;  Surgeon: Leanora Cover, MD;  Location: [facility];  Service: Orthopedics;  Laterality: Left;    Family History  Problem Relation Age of Onset  . Autoimmune disease  Mother   . Alcohol abuse Father   . Hyperlipidemia Father   . Hypertension Father   . Amenorrhea Brother   . Diabetes Maternal Grandmother   . Diabetes Maternal Grandfather      REVIEW OF SYSTEMS: Noncontributory   PHYSICAL EXAMINATION:  Today's Vitals   02/20/20 1434 02/27/20 1224  BP:  (!) 149/102  Pulse:  78  Resp:  14  Temp:  97.7 F (36.5 C)   TempSrc:  Oral  SpO2:  100%  Weight: 77.1 kg 81.3 kg  Height: _0  (1.575 m) _1  (1.575 m)  PainSc:  0-No pain    GENERAL: Well-developed, well-nourished, in no acute distress. Alert  and cooperative.  LOWER EXTREMITY EXAM: DERMATOLOGY: Skin warm and supple bilateral, no open lesions, nails within normal limits. Soft tissue mass at right 2nd toe unchanged from previous.  VASCULAR: Dorsalis Pedis 2/4 and Posterior Tibial 2/4 pedal pulses bilateral, Temperature gradient within normal limits, Capillary fill time 3 seconds, positive pedal hair growth present bilateral. NEUROLOGY: Gross sensation present with light touch bilateral MUSCULOSKELETAL: +foot deformity/ pain with palpation to area of concern   XRAY, RIGHT FOOT in chart   ASSESSMENTS:  1.Cyst of toe 2. Right 2nd toe pain   PLAN OF CARE: Patient seen and evaluated 1. History and physical completed 2. Patient NPO since midnight 3. Previous Imaging reviewed  4. Consent for surgery explained and obtained for excision of cyst right 2nd toe risk and benefits explained; all questions answered and no guarantees granted. 5. Patient to undergo above surgical procedure 6. Case discussed with patient and to meet with husband represented after the procedure 7. To resume all home meds post-procedure and to give prn pain meds, anti-nausea, and anti-constipation medications post op 8. Will continue to follow closely/ see post-operative in the office within 1 week.  Landis Martins, DPM

## 2020-02-27 NOTE — Brief Op Note (Signed)
02/27/2020  2:39 PM  PATIENT:  Holly Richards  39 y.o. female  PRE-OPERATIVE DIAGNOSIS:  GANGLION CYST AND HAMMER TOE RIGHT FOOT  POST-OPERATIVE DIAGNOSIS:  GANGLION CYST AND HAMMER TOE RIGHT FOOT  PROCEDURE:  Procedure(s) with comments: REMOVAL GANGLION CYST RIGHT FOOT (Right) - MAC WITH LOCAL HAMMER TOE REPAIR SECOND TOE OF RIGHT FOOT (Right)  SURGEON:  Surgeon(s) and Role:    * Landis Martins, DPM - Primary  PHYSICIAN ASSISTANT: None  ASSISTANTS: none   ANESTHESIA:   local with MAC  EBL:  5 mL   BLOOD ADMINISTERED:none  DRAINS: none   LOCAL MEDICATIONS USED:  MARCAINE AND LIDOCAINE     SPECIMEN:  Source of Specimen:  soft tissue mass right 2nd toe  DISPOSITION OF SPECIMEN:  PATHOLOGY  COUNTS:  YES  TOURNIQUET:   Total Tourniquet Time Documented: Calf (Right) - 21 minutes Total: Calf (Right) - 21 minutes   DICTATION: .Note written in EPIC  PLAN OF CARE: Discharge to home after PACU  PATIENT DISPOSITION:  PACU - hemodynamically stable.   Delay start of Pharmacological VTE agent (>24hrs) due to surgical blood loss or risk of bleeding: no

## 2020-02-27 NOTE — Discharge Instructions (Signed)
Attached to Paper chart (give to patient [facility] instructions)    Post Anesthesia Home Care Instructions  Activity: Get plenty of rest for the remainder of the day. A responsible individual must stay with you for 24 hours following the procedure.  For the next 24 hours, DO NOT: -Drive a car -Paediatric nurse -Drink alcoholic beverages -Take any medication unless instructed by your physician -Make any legal decisions or sign important papers.  Meals: Start with liquid foods such as gelatin or soup. Progress to regular foods as tolerated. Avoid greasy, spicy, heavy foods. If nausea and/or vomiting occur, drink only clear liquids until the nausea and/or vomiting subsides. Call your physician if vomiting continues.  Special Instructions/Symptoms: Your throat may feel dry or sore from the anesthesia or the breathing tube placed in your throat during surgery. If this causes discomfort, gargle with warm salt water. The discomfort should disappear within 24 hours.

## 2020-02-27 NOTE — Anesthesia Postprocedure Evaluation (Signed)
Anesthesia Post Note  Patient: [name]  Procedure(s) Performed: REMOVAL GANGLION CYST RIGHT FOOT (Right Foot) HAMMER TOE REPAIR SECOND TOE OF RIGHT FOOT (Right Toe)     Patient location during evaluation: PACU Anesthesia Type: MAC Level of consciousness: awake Pain management: pain level controlled Vital Signs Assessment: post-procedure vital signs reviewed and stable Respiratory status: spontaneous breathing Cardiovascular status: stable Postop Assessment: no apparent nausea or vomiting Anesthetic complications: no   No complications documented.  Last Vitals:  Vitals:   02/27/20 1224 02/27/20 1445  BP: (!) 149/102 127/85  Pulse: 78 80  Resp: 14 12  Temp: 36.5 C 36.6 C  SpO2: 100% 96%    Last Pain:  Vitals:   02/27/20 1445  TempSrc:   PainSc: 0-No pain                 Holly Richards

## 2020-02-28 ENCOUNTER — Encounter (HOSPITAL_BASED_OUTPATIENT_CLINIC_OR_DEPARTMENT_OTHER): Payer: Self-pay | Admitting: Sports Medicine

## 2020-02-28 LAB — SURGICAL PATHOLOGY

## 2020-03-06 ENCOUNTER — Encounter: Payer: Self-pay | Admitting: Sports Medicine

## 2020-03-06 ENCOUNTER — Ambulatory Visit (INDEPENDENT_AMBULATORY_CARE_PROVIDER_SITE_OTHER): Payer: No Typology Code available for payment source | Admitting: Sports Medicine

## 2020-03-06 ENCOUNTER — Other Ambulatory Visit: Payer: Self-pay

## 2020-03-06 DIAGNOSIS — Z9889 Other specified postprocedural states: Secondary | ICD-10-CM

## 2020-03-06 DIAGNOSIS — E119 Type 2 diabetes mellitus without complications: Secondary | ICD-10-CM

## 2020-03-06 DIAGNOSIS — M79674 Pain in right toe(s): Secondary | ICD-10-CM

## 2020-03-06 DIAGNOSIS — M67471 Ganglion, right ankle and foot: Secondary | ICD-10-CM

## 2020-03-06 NOTE — Progress Notes (Signed)
Subjective: Holly Richards is a 39 y.o. female patient seen today in office for POV #1 (DOS 02/27/20), S/P removal of ganglion of cyst of right 2nd toe with arthroplasty. Patient denies pain at surgical site, denies calf pain, denies headache, chest pain, shortness of breath, nausea, vomiting, fever, or chills. No other issues noted.   Patient Active Problem List   Diagnosis Date Noted  . Closed fracture of lower end of left radius with malunion 10/28/2019  . Atrophic vaginitis 04/05/2019  . OAG (open angle glaucoma) suspect, low risk, bilateral 02/12/2019  . Mixed hyperlipidemia 09/06/2018  . Subclinical hypothyroidism 09/17/2015  . Liver enzyme elevation 09/17/2015  . Type 2 diabetes mellitus without complication, without long-term current use of insulin ([place]) 09/12/2015  . Essential hypertension, benign 09/12/2015  . 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 Preventive measure 02/09/2013  . Elevated blood-pressure reading without diagnosis of hypertension 02/09/2013  . Hypothyroidism 10/15/2012  . Lumbar strain 05/06/2012  . Mood disorder ([place])   . Anxiety   . 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 Obesity 03/05/2011  . Thyroid activity decreased 12/30/2010  . Celiac disease/sprue 11/21/2010    Current Outpatient Medications on File Prior to Visit  Medication Sig Dispense Refill  . docusate sodium (COLACE) 100 MG capsule Take 1 capsule (100 mg total) by mouth daily as needed. 30 capsule 2  . ibuprofen (ADVIL) 800 MG tablet Take 1 tablet (800 mg total) by mouth every 8 (eight) hours as needed. 30 tablet 0  . Multiple Vitamins-Minerals (MULTIVITAMIN PO) Take 1 tablet by mouth daily.    . promethazine (PHENERGAN) 12.5 MG tablet Take 1 tablet (12.5 mg total) by mouth every 6 (six) hours as needed for nausea or vomiting. 30 tablet 0   No current facility-administered medications on file prior to visit.    Allergies  Allergen Reactions  . Oseltamivir Phosphate Anaphylaxis    tamiflu     Objective: There were no vitals filed for this visit.  General: No acute distress, AAOx3  Right foot: Sutures intact with no gapping or dehiscence at surgical site, mild swelling to right second toe, no erythema, no warmth, no drainage, no signs of infection noted, Capillary fill time <3 seconds in all digits, gross sensation present via light touch to right foot.  No pain with calf compression.   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    -  Primary   Toe pain, right       S/P foot surgery, right           -Patient seen and evaluated -Applied antibiotic cream and dry sterile dressing to surgical site right foot  -Advised patient to keep surgical site clean and dry may change dressing and reapply antibiotic cream and gauze and Coban as instructed -Advised patient to continue with post-op shoe on the right for a few more days and then slowly may try a tennis shoe or a shoe with space that does not rub the toe -Advised patient to limit activity to tolerance -Advised patient to ice and elevate as necessary  -Will plan for suture removal at next office visit. In the meantime, patient to call office if any issues or problems arise.  Advised patient that when she returns to work on  Sunday if she still has swelling or is concerned may get a work excuse from my office  Landis Martins, DPM

## 2020-03-12 ENCOUNTER — Other Ambulatory Visit: Payer: Self-pay

## 2020-03-12 ENCOUNTER — Ambulatory Visit (INDEPENDENT_AMBULATORY_CARE_PROVIDER_SITE_OTHER): Payer: No Typology Code available for payment source | Admitting: Podiatry

## 2020-03-12 ENCOUNTER — Encounter: Payer: Self-pay | Admitting: Podiatry

## 2020-03-12 DIAGNOSIS — Z9889 Other specified postprocedural states: Secondary | ICD-10-CM

## 2020-03-12 DIAGNOSIS — M67471 Ganglion, right ankle and foot: Secondary | ICD-10-CM

## 2020-03-12 NOTE — Progress Notes (Signed)
  Subjective:  Patient ID: Holly Richards, female    DOB: 03-Jun-1980,  MRN: [id number]  Chief Complaint  Patient presents with  . Routine Post Op    I am still swollen on the 2nd toe right foot     DOS: 02/27/20 Procedure: Excision of cyst and interphalangeal joint arthroplasty right 2nd toe   39 y.o. female presents with the above complaint. History confirmed with patient. States the toe is a little swollen but denies pain and complaints.  Objective:  Physical Exam: tenderness at the surgical site and local edema noted. Incision: healing well, no significant drainage, no dehiscence, no significant erythema  Assessment:   1. 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 Post-operative state     Plan:  Patient was evaluated and treated and all questions answered.  Post-operative State -XR reviewed with patient -Sutures removed -Ok to start showering at this time. Advised they cannot soak. -Dressing applied consisting of medihoney and band-aid -WBAT in normal shoegear  No follow-ups on file.

## 2020-03-27 ENCOUNTER — Ambulatory Visit (INDEPENDENT_AMBULATORY_CARE_PROVIDER_SITE_OTHER): Payer: No Typology Code available for payment source | Admitting: Sports Medicine

## 2020-03-27 ENCOUNTER — Encounter: Payer: Self-pay | Admitting: Sports Medicine

## 2020-03-27 ENCOUNTER — Other Ambulatory Visit: Payer: Self-pay

## 2020-03-27 DIAGNOSIS — M67471 Ganglion, right ankle and foot: Secondary | ICD-10-CM

## 2020-03-27 DIAGNOSIS — Z9889 Other specified postprocedural states: Secondary | ICD-10-CM

## 2020-03-27 DIAGNOSIS — E119 Type 2 diabetes mellitus without complications: Secondary | ICD-10-CM

## 2020-03-27 NOTE — Progress Notes (Signed)
Subjective: Holly Richards is a 40 y.o. female patient seen today in office for POV #3 (DOS 02/27/20), S/P removal of ganglion of cyst of right 2nd toe with arthroplasty. Patient denies pain at surgical site, denies calf pain, denies headache, chest pain, shortness of breath, nausea, vomiting, fever, or chills. No other issues noted.   Patient Active Problem List   Diagnosis Date Noted  . Closed fracture of lower end of left radius with malunion 10/28/2019  . Atrophic vaginitis 04/05/2019  . OAG (open angle glaucoma) suspect, low risk, bilateral 02/12/2019  . Mixed hyperlipidemia 09/06/2018  . Subclinical hypothyroidism 09/17/2015  . Liver enzyme elevation 09/17/2015  . Type 2 diabetes mellitus without complication, without long-term current use of insulin ([place]) 09/12/2015  . Essential hypertension, benign 09/12/2015  . 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 Preventive measure 02/09/2013  . Elevated blood-pressure reading without diagnosis of hypertension 02/09/2013  . Hypothyroidism 10/15/2012  . Lumbar strain 05/06/2012  . Mood disorder ([place])   . Anxiety   . 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 Obesity 03/05/2011  . Thyroid activity decreased 12/30/2010  . Celiac disease/sprue 11/21/2010    Current Outpatient Medications on File Prior to Visit  Medication Sig Dispense Refill  . docusate sodium (COLACE) 100 MG capsule Take 1 capsule (100 mg total) by mouth daily as needed. 30 capsule 2  . ibuprofen (ADVIL) 800 MG tablet Take 1 tablet (800 mg total) by mouth every 8 (eight) hours as needed. 30 tablet 0  . Multiple Vitamins-Minerals (MULTIVITAMIN PO) Take 1 tablet by mouth daily.    . promethazine (PHENERGAN) 12.5 MG tablet Take 1 tablet (12.5 mg total) by mouth every 6 (six) hours as needed for nausea or vomiting. 30 tablet 0   No current facility-administered medications on file prior to visit.    Allergies  Allergen Reactions  . Oseltamivir Phosphate Anaphylaxis    tamiflu     Objective: There were no vitals filed for this visit.  General: No acute distress, AAOx3  Right foot: Incision site healing well, mild swelling to right second toe, no erythema, no warmth, no drainage, no signs of infection noted, Capillary fill time <3 seconds in all digits, gross sensation present via light touch to right foot.  No pain with calf compression.   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  Landis Martins, DPM

## 2020-06-04 ENCOUNTER — Telehealth: Payer: Self-pay | Admitting: Family Medicine

## 2020-06-04 NOTE — Telephone Encounter (Signed)
**  After-Hours Emergency Line Call**  Sabrina is a 40 year old female calling into the after-hours line for evaluation of vomiting and diarrhea.  She was of her usual state of health until approximately 30 minutes ago this morning when she woke up with generalized abdominal pain with cramping, NBNB vomiting, and brown watery diarrhea.  Temperature to 100 F.  No new foods in the past several days.  Lives with her husband who otherwise has felt well with the exception of stuffy nose and history of allergies.  Patient has a history of cholecystectomy, otherwise no abdominal surgeries.  Works from home without known sick contacts.  She is seeking advice on evaluation.  Sounds mostly suspicious for gastroenteritis.  Patient wishes to stay home if at all possible, lives about an hour away.  Recommended ED evaluation if her abdominal pain becomes more severe/persistent or if unable to maintain hydration to proceed with further abdominal imaging/fluids.  Fortunately we do have late afternoon appointments in the clinic today, offered to schedule for evaluation with Covid testing, however she would like to hold off for now.  She is vaccinated and stays at home for work, recommended proceeding with Covid testing if the symptoms persist.  Encouraged small sips of fluids throughout the day to maintain hydration.  To call our clinic if she changes her mind to schedule an appointment, otherwise precautions as above.  Route to PCP.  Patriciaann Clan, DO

## 2020-06-26 ENCOUNTER — Ambulatory Visit (INDEPENDENT_AMBULATORY_CARE_PROVIDER_SITE_OTHER): Payer: No Typology Code available for payment source | Admitting: Family Medicine

## 2020-06-26 ENCOUNTER — Encounter: Payer: Self-pay | Admitting: Family Medicine

## 2020-06-26 ENCOUNTER — Other Ambulatory Visit: Payer: Self-pay

## 2020-06-26 VITALS — BP 130/94 | HR 75 | Ht 62.0 in | Wt 175.2 lb

## 2020-06-26 DIAGNOSIS — E782 Mixed hyperlipidemia: Secondary | ICD-10-CM | POA: Diagnosis not present

## 2020-06-26 DIAGNOSIS — E119 Type 2 diabetes mellitus without complications: Secondary | ICD-10-CM

## 2020-06-26 DIAGNOSIS — E039 Hypothyroidism, unspecified: Secondary | ICD-10-CM | POA: Diagnosis not present

## 2020-06-26 DIAGNOSIS — Q969 Turner's syndrome, unspecified: Secondary | ICD-10-CM

## 2020-06-26 LAB — POCT GLYCOSYLATED HEMOGLOBIN (HGB A1C): HbA1c, POC (controlled diabetic range): 6.4 % (ref 0.0–7.0)

## 2020-06-26 NOTE — Assessment & Plan Note (Signed)
Patient has stopped taking synthroid despite counseling at last 2 appointments. Will check TSH today. Recommend patient continue using synthroid.

## 2020-06-26 NOTE — Progress Notes (Signed)
    SUBJECTIVE:   CHIEF COMPLAINT / HPI:   Turner's syndrome: had fx this year. Needs DEXA- scheduled for May.  DM: A1c in October 5.8%, off all diabetic meds. A1c today is 6.4%. This may reflect increase in BG over time, but is also within error of 0.5%.  TSH: patient is supposed to take levothyroxine, but is not. Last TSH in October 2021 7. Will check today. Recommend patient take levothyroxine daily.  HLD: not fasting today.  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 PERRLA. Sclera without injection or icterus. MMM.  Neck: Supple. Thyroid smooth and not palpable Cardiac: Regular rate and rhythm. Normal S1/S2. No murmurs, rubs, or gallops appreciated. 2+ radial pulses. Lungs: Clear bilaterally to ascultation. No increased WOB, no accessory muscle usage. No w/r/r. Neuro: Alert and at baseline Ext: no edema Psych: Pleasant and appropriate  Diabetic Foot Exam - Simple   Simple Foot Form Diabetic Foot exam was performed with the following findings: Yes 06/26/2020  8:43 AM  Visual Inspection No deformities, no ulcerations, no other skin breakdown bilaterally: Yes Sensation Testing Intact to touch and monofilament testing bilaterally: Yes Pulse Check Posterior Tibialis and Dorsalis pulse intact bilaterally: Yes Comments    ASSESSMENT/PLAN:   Type 2 diabetes mellitus without complication, without long-term current use of insulin ([facility]) Mild elevation to 6.4% in A1c. Patient would prefer not to start a new medication, wants to add in more exercise and dieting first, which is very reasonable approach. Recommend return in 3 months (July) to recheck A1c.   Mixed  hyperlipidemia Patient not fasting. Will recheck ldl when returns in 3 months (fasting).  Turner syndrome Dexa scan scheduled for May  Hypothyroidism Patient has stopped taking synthroid despite counseling at last 2 appointments. Will check TSH today. Recommend patient continue using synthroid.     Gladys Damme, MD [place]

## 2020-06-26 NOTE — Assessment & Plan Note (Signed)
Patient not fasting. Will recheck ldl when returns in 3 months (fasting).

## 2020-06-26 NOTE — Assessment & Plan Note (Signed)
Dexa scan scheduled for May

## 2020-06-26 NOTE — Patient Instructions (Addendum)
It was a pleasure to see you today!  1. We will get some labs today.  If they are abnormal or we need to do something about them, I will call you.  If they are normal, I will send you a message on MyChart (if it is active) or a letter in the mail.  If you don't hear from [place] in 2 weeks, please call the office  [phone].  2. I recommend at least 90 minutes of exercise per week and we will follow up in 3 months to check on your A1c! Keep up all your hard work it is certainly paying off!   Be Well,  Dr. Chauncey Reading

## 2020-06-26 NOTE — Assessment & Plan Note (Signed)
Mild elevation to 6.4% in A1c. Patient would prefer not to start a new medication, wants to add in more exercise and dieting first, which is very reasonable approach. Recommend return in 3 months (July) to recheck A1c.

## 2020-06-27 ENCOUNTER — Telehealth: Payer: Self-pay | Admitting: Family Medicine

## 2020-06-27 LAB — TSH: TSH: 9.39 u[IU]/mL — ABNORMAL HIGH (ref 0.450–4.500)

## 2020-06-27 MED ORDER — LEVOTHYROXINE SODIUM 88 MCG PO TABS: 88.0000 ug | ORAL_TABLET | ORAL | 3 refills | Status: AC

## 2020-06-27 NOTE — Telephone Encounter (Signed)
Patient notified of hypothyroidism via [place]- prescribed former dose of synthroid 88 mcg.   Gladys Damme, MD [facility], PGY-2

## 2020-06-27 NOTE — Addendum Note (Signed)
Addended by: Bridget Hartshorn on: 06/27/2020 12:01 PM   Modules accepted: Orders

## 2020-07-23 ENCOUNTER — Encounter: Payer: Self-pay | Admitting: Family Medicine

## 2020-07-23 ENCOUNTER — Telehealth (INDEPENDENT_AMBULATORY_CARE_PROVIDER_SITE_OTHER): Payer: No Typology Code available for payment source | Admitting: Family Medicine

## 2020-07-23 DIAGNOSIS — R058 Other specified cough: Secondary | ICD-10-CM

## 2020-07-23 DIAGNOSIS — J069 Acute upper respiratory infection, unspecified: Secondary | ICD-10-CM

## 2020-07-23 MED ORDER — BENZONATATE 200 MG PO CAPS: 200.0000 mg | ORAL_CAPSULE | Freq: Two times a day (BID) | ORAL | 0 refills | Status: AC | PRN

## 2020-07-23 NOTE — Progress Notes (Signed)
[facility] Visit  Patient consented to have virtual visit and was identified by name and date of birth. Method of visit: Video, audio was interrupted but continued via telephone while still using video.  Encounter participants: Patient: Holly Richards - located at Home Provider: Rise Patience - located off [facility]  Chief Complaint: Cough  HPI: Patient reports sore throat that began 5/4, on 5/5 patient developed fever of 101.56F (via home thermometer) and body aches. She went to an urgent care and was tested negative for COVID, flu, and strep; at the urgent care her temperature reportedly was 98.58F. She was diagnosed with pharyngitis and used gargle rinses and OTC medications (Mucinex, NiQuil, DayQuil) without improvement. The symptoms have persistent and cough now accompanied by sputum production. Most recent temperature at home was 100F. Reports associated congestion, rhinorrhea, sore throat, sinus pressure when coughing or blowing her nose. Denies HA, shortness of breath with walking, nausea, vomiting.  Patient stated that she has had illnesses such as this before and has had to have multiple antibiotics in the past as her illnesses typically are prolonged. Patient unsure of what antibiotics have been used in the past but does state that she has an allergy documented against Tamiflu as she had an anaphylactic reaction, but was also taking another medication at that time and believes it contributed.    Only medication is multivitamin.   ROS: per HPI  Pertinent PMHx: Hx of diabetes and HTN, hypothyroidism. 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 Patient with URI symptoms of cough, congestion, sore throat, and fevers of 5 days duration. Unable to determine viral vs bacterial cause at this time and unable to complete a physical exam to determine if there is focality to lung exam. Patient instructed to continue OTC medications and will send in Benzonatate for the cough. If worsening or continuing to have significant symptoms by 5/12, patient instructed to call back or visit an urgent care to ensure no concern for pneumonia or bacterial illness requiring antibiotics.  - Return to care by 5/12 if not improved or if worsening - Benzonatate for cough - Continue OTC medications and honey for throat pain   Time spent during visit with patient: 23 minutes

## 2020-07-23 NOTE — Patient Instructions (Signed)
We are unable to tell if this is a viral or bacterial illness at this time as it has not been more than a week in duration. If your symptoms worsen by 5/12 or you are seeing no improvement then I recommend calling back for another appointment or going back to the urgent care to see if you will need antibiotics or imaging depending on how your lungs sound to the medical professional.

## 2020-07-26 ENCOUNTER — Telehealth: Payer: Self-pay | Admitting: Family Medicine

## 2020-07-26 ENCOUNTER — Encounter: Payer: Self-pay | Admitting: Family Medicine

## 2020-07-26 NOTE — Telephone Encounter (Signed)
**  After Hours/ Emergency Line Call**  Received a call to report that [place] has been having upper respiratory symptoms and was told to call back today if they persisted.  She reports that they have continued and was calling to let her doctor know.  She states that she didn't mean to page the Emergency Line Doctor, only to send a message to her doctor.  Denying difficulty breathing.  No respiratory distress over the phone.  Advised that this message would be forwarded to Dr. Oleh Genin who saw patient most recently and PCP, Dr. Chauncey Reading.  Recommended that patient call the office or send mychart message if she doesn't hear by the afternoon.  Red flags discussed.  Will forward to PCP.   [place] Constable, DO PGY-3, [facility] 07/26/2020 6:36 AM

## 2020-08-08 ENCOUNTER — Other Ambulatory Visit: Payer: Self-pay

## 2020-08-08 ENCOUNTER — Ambulatory Visit
Admission: RE | Admit: 2020-08-08 | Discharge: 2020-08-08 | Disposition: A | Discharge status: Home / Self Care | Payer: No Typology Code available for payment source | Source: Ambulatory Visit | Attending: Family Medicine | Admitting: Family Medicine

## 2020-08-08 DIAGNOSIS — Q969 Turner's syndrome, unspecified: Secondary | ICD-10-CM

## 2020-11-15 ENCOUNTER — Encounter: Payer: Self-pay | Admitting: Family Medicine

## 2021-08-20 ENCOUNTER — Encounter: Payer: Self-pay | Admitting: *Deleted
